# Patient Record
Sex: Female | Born: 1955 | Race: White | Hispanic: No | Marital: Married | Smoking: Never smoker
Health system: Southern US, Community
[De-identification: ages and names within clinical notes are randomized; demographics above are authoritative.]

## PROBLEM LIST (undated history)

## (undated) DIAGNOSIS — N390 Urinary tract infection, site not specified: Secondary | ICD-10-CM

## (undated) DIAGNOSIS — E785 Hyperlipidemia, unspecified: Secondary | ICD-10-CM

## (undated) DIAGNOSIS — G510 Bell's palsy: Secondary | ICD-10-CM

## (undated) DIAGNOSIS — N879 Dysplasia of cervix uteri, unspecified: Secondary | ICD-10-CM

## (undated) DIAGNOSIS — R87619 Unspecified abnormal cytological findings in specimens from cervix uteri: Secondary | ICD-10-CM

## (undated) HISTORY — PX: TUBAL LIGATION: SHX77

## (undated) HISTORY — PX: OTHER SURGICAL HISTORY: SHX169

## (undated) HISTORY — DX: Dysplasia of cervix uteri, unspecified: N87.9

## (undated) HISTORY — DX: Unspecified abnormal cytological findings in specimens from cervix uteri: R87.619

---

## 1997-07-03 DIAGNOSIS — N879 Dysplasia of cervix uteri, unspecified: Secondary | ICD-10-CM

## 1997-07-03 HISTORY — DX: Dysplasia of cervix uteri, unspecified: N87.9

## 1997-07-03 HISTORY — PX: BREAST BIOPSY: SHX20

## 1997-07-03 HISTORY — PX: LEEP: SHX91

## 1997-07-03 HISTORY — PX: TOTAL VAGINAL HYSTERECTOMY: SHX2548

## 2004-06-07 ENCOUNTER — Ambulatory Visit: Payer: Self-pay

## 2005-06-07 ENCOUNTER — Ambulatory Visit: Payer: Self-pay

## 2006-06-12 ENCOUNTER — Ambulatory Visit: Payer: Self-pay

## 2007-06-18 ENCOUNTER — Ambulatory Visit: Payer: Self-pay

## 2008-06-17 ENCOUNTER — Ambulatory Visit: Payer: Self-pay | Admitting: Internal Medicine

## 2008-10-13 ENCOUNTER — Ambulatory Visit: Payer: Self-pay

## 2010-02-08 ENCOUNTER — Ambulatory Visit: Payer: Self-pay | Admitting: General Practice

## 2010-11-07 ENCOUNTER — Ambulatory Visit: Payer: Self-pay | Admitting: Gastroenterology

## 2011-02-14 ENCOUNTER — Ambulatory Visit: Payer: Self-pay | Admitting: Unknown Physician Specialty

## 2011-02-28 ENCOUNTER — Ambulatory Visit: Payer: Self-pay | Admitting: General Practice

## 2015-07-07 ENCOUNTER — Ambulatory Visit: Payer: Self-pay | Admitting: Family

## 2015-07-07 ENCOUNTER — Encounter: Payer: Self-pay | Admitting: Family

## 2015-07-07 VITALS — BP 110/70 | HR 91 | Temp 98.4°F

## 2015-07-07 DIAGNOSIS — J019 Acute sinusitis, unspecified: Secondary | ICD-10-CM

## 2015-07-07 MED ORDER — AZITHROMYCIN 250 MG PO TABS: ORAL_TABLET | ORAL | Status: DC

## 2015-07-07 MED ORDER — FLUCONAZOLE 150 MG PO TABS: 150.0000 mg | ORAL_TABLET | Freq: Once | ORAL | Status: DC

## 2015-07-07 NOTE — Progress Notes (Signed)
S/  4 days hx nasal congestion , pressure Left ear , malaise facial pain Hx of allergies ,denies cp or sob , taking otc s   O/ VSS mildly ill appearing NAD ENT tms dull retracted , nasal turbinates swollen and red  + frontal , max tenderness , throat clear , heart rsr lungs clear A/ rhinosinusitis  P/ zpack . Nasal saline products bid and prn. Continue supportive care rx diflucan for prn

## 2015-07-19 ENCOUNTER — Ambulatory Visit: Payer: Self-pay | Admitting: Physician Assistant

## 2015-07-19 ENCOUNTER — Encounter: Payer: Self-pay | Admitting: Physician Assistant

## 2015-07-19 VITALS — BP 130/80 | HR 75 | Temp 98.4°F

## 2015-07-19 DIAGNOSIS — J069 Acute upper respiratory infection, unspecified: Secondary | ICD-10-CM

## 2015-07-19 MED ORDER — FLUCONAZOLE 150 MG PO TABS: 150.0000 mg | ORAL_TABLET | Freq: Once | ORAL | Status: DC

## 2015-07-19 MED ORDER — CEFDINIR 300 MG PO CAPS: 300.0000 mg | ORAL_CAPSULE | Freq: Two times a day (BID) | ORAL | Status: DC

## 2015-07-19 MED ORDER — METHYLPREDNISOLONE 4 MG PO TBPK: ORAL_TABLET | ORAL | Status: DC

## 2015-07-19 NOTE — Progress Notes (Signed)
S: C/o continued runny nose, congestion, and cough, has had sx for about 2 weeks, no fever, chills, cp/sob, v/d; mucus is yellow, cough is sporadic, is really tired, finished zpack, feels a little better but still sick, prednisone makes her shakey  O: PE: vitals wnl, nad, perrl eomi, + subconjunctival hemorrhage in r eye, normocephalic, tms dull, nasal mucosa red and swollen, throat injected, neck supple no lymph, lungs c t a, cv rrr, neuro intact  A:  Acute uri   P: omnicef, medrol dose pack, diflucan;  drink fluids, continue regular meds , use otc meds of choice, return if not improving in 5 days, return earlier if worsening

## 2015-09-07 ENCOUNTER — Other Ambulatory Visit: Payer: Self-pay | Admitting: Physician Assistant

## 2015-09-07 DIAGNOSIS — Z1231 Encounter for screening mammogram for malignant neoplasm of breast: Secondary | ICD-10-CM

## 2015-09-15 ENCOUNTER — Ambulatory Visit: Payer: Self-pay | Admitting: Physician Assistant

## 2015-09-16 ENCOUNTER — Ambulatory Visit
Admission: RE | Admit: 2015-09-16 | Discharge: 2015-09-16 | Disposition: A | Discharge status: Home / Self Care | Payer: Managed Care, Other (non HMO) | Source: Ambulatory Visit | Attending: Physician Assistant | Admitting: Physician Assistant

## 2015-09-16 DIAGNOSIS — Z1231 Encounter for screening mammogram for malignant neoplasm of breast: Secondary | ICD-10-CM | POA: Diagnosis not present

## 2015-09-27 ENCOUNTER — Ambulatory Visit: Payer: Self-pay | Admitting: Physician Assistant

## 2015-09-27 ENCOUNTER — Encounter: Payer: Self-pay | Admitting: Physician Assistant

## 2015-09-27 VITALS — BP 120/79 | HR 81 | Temp 98.4°F

## 2015-09-27 DIAGNOSIS — G43001 Migraine without aura, not intractable, with status migrainosus: Secondary | ICD-10-CM

## 2015-09-27 DIAGNOSIS — R1310 Dysphagia, unspecified: Secondary | ICD-10-CM

## 2015-09-27 DIAGNOSIS — Z299 Encounter for prophylactic measures, unspecified: Secondary | ICD-10-CM

## 2015-09-27 DIAGNOSIS — R5383 Other fatigue: Secondary | ICD-10-CM

## 2015-09-27 MED ORDER — RIZATRIPTAN BENZOATE 10 MG PO TABS: 10.0000 mg | ORAL_TABLET | ORAL | Status: DC | PRN

## 2015-09-27 NOTE — Progress Notes (Signed)
S: pt here for yearly labs and also has several questions 1. Would like a shingles vaccine  2. 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 Can we give a refill on her maxalt that she uses occasionally 4. ?about "dense tissue" remark on her mammogram report 5. 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

## 2015-09-28 LAB — CMP12+LP+TP+TSH+6AC+CBC/D/PLT
ALBUMIN: 4.4 g/dL (ref 3.6–4.8)
ALK PHOS: 91 IU/L (ref 39–117)
ALT: 16 IU/L (ref 0–32)
AST: 20 IU/L (ref 0–40)
Albumin/Globulin Ratio: 2.2 (ref 1.2–2.2)
BASOS: 0 %
BUN / CREAT RATIO: 29 — AB (ref 11–26)
BUN: 19 mg/dL (ref 8–27)
Basophils Absolute: 0 10*3/uL (ref 0.0–0.2)
Bilirubin Total: <0.2 mg/dL (ref 0.0–1.2)
CALCIUM: 9.1 mg/dL (ref 8.7–10.3)
CHLORIDE: 102 mmol/L (ref 96–106)
CHOL/HDL RATIO: 3.3 ratio (ref 0.0–4.4)
CHOLESTEROL TOTAL: 194 mg/dL (ref 100–199)
CREATININE: 0.65 mg/dL (ref 0.57–1.00)
EOS (ABSOLUTE): 0.2 10*3/uL (ref 0.0–0.4)
EOS: 3 %
Free Thyroxine Index: 1.8 (ref 1.2–4.9)
GFR calc Af Amer: 112 mL/min/{1.73_m2} (ref >59)
GFR calc non Af Amer: 97 mL/min/{1.73_m2} (ref >59)
GGT: 17 IU/L (ref 0–60)
GLUCOSE: 106 mg/dL — AB (ref 65–99)
Globulin, Total: 2 g/dL (ref 1.5–4.5)
HDL: 59 mg/dL (ref >39)
HEMATOCRIT: 40.6 % (ref 34.0–46.6)
HEMOGLOBIN: 13.2 g/dL (ref 11.1–15.9)
Immature Grans (Abs): 0 10*3/uL (ref 0.0–0.1)
Immature Granulocytes: 0 %
Iron: 47 ug/dL (ref 27–159)
LDH: 187 IU/L (ref 119–226)
LDL CALC: 119 mg/dL — AB (ref 0–99)
LYMPHS ABS: 1.5 10*3/uL (ref 0.7–3.1)
Lymphs: 22 %
MCH: 28 pg (ref 26.6–33.0)
MCHC: 32.5 g/dL (ref 31.5–35.7)
MCV: 86 fL (ref 79–97)
MONOS ABS: 0.3 10*3/uL (ref 0.1–0.9)
Monocytes: 4 %
NEUTROS ABS: 4.7 10*3/uL (ref 1.4–7.0)
Neutrophils: 71 %
POTASSIUM: 5.5 mmol/L — AB (ref 3.5–5.2)
Phosphorus: 3 mg/dL (ref 2.5–4.5)
Platelets: 231 10*3/uL (ref 150–379)
RBC: 4.72 x10E6/uL (ref 3.77–5.28)
RDW: 14.4 % (ref 12.3–15.4)
SODIUM: 142 mmol/L (ref 134–144)
T3 Uptake Ratio: 30 % (ref 24–39)
T4, Total: 6.1 ug/dL (ref 4.5–12.0)
TSH: 3.84 u[IU]/mL (ref 0.450–4.500)
Total Protein: 6.4 g/dL (ref 6.0–8.5)
Triglycerides: 81 mg/dL (ref 0–149)
URIC ACID: 4.1 mg/dL (ref 2.5–7.1)
VLDL Cholesterol Cal: 16 mg/dL (ref 5–40)
WBC: 6.7 10*3/uL (ref 3.4–10.8)

## 2015-09-28 LAB — B12 AND FOLATE PANEL
FOLATE: 16.9 ng/mL (ref >3.0)
VITAMIN B 12: 575 pg/mL (ref 211–946)

## 2015-09-28 LAB — HCV COMMENT:

## 2015-09-28 LAB — HEPATITIS C ANTIBODY (REFLEX): HCV Ab: <0.1 s/co ratio (ref 0.0–0.9)

## 2015-09-28 LAB — VITAMIN D 25 HYDROXY (VIT D DEFICIENCY, FRACTURES): Vit D, 25-Hydroxy: 35.2 ng/mL (ref 30.0–100.0)

## 2015-09-30 LAB — HGB A1C W/O EAG: Hgb A1c MFr Bld: 6.1 % — ABNORMAL HIGH (ref 4.8–5.6)

## 2015-09-30 LAB — SPECIMEN STATUS REPORT

## 2015-09-30 NOTE — Progress Notes (Signed)
Left message on patient's voice mail to return my call.

## 2015-10-01 NOTE — Progress Notes (Signed)
I spoke with the patient about her lab results and she expressed understanding.  I faxed a copy of the results to the patient per her request.

## 2015-10-26 ENCOUNTER — Encounter: Payer: Self-pay | Admitting: Physician Assistant

## 2015-10-26 ENCOUNTER — Ambulatory Visit: Payer: Self-pay | Admitting: Physician Assistant

## 2015-10-26 VITALS — BP 130/80 | HR 81 | Temp 98.1°F

## 2015-10-26 DIAGNOSIS — G4700 Insomnia, unspecified: Secondary | ICD-10-CM

## 2015-10-26 MED ORDER — TRAZODONE HCL 150 MG PO TABS: 150.0000 mg | ORAL_TABLET | Freq: Every evening | ORAL | Status: DC | PRN

## 2015-10-26 NOTE — Progress Notes (Signed)
S: c/o anxiety and insomnia, states her mother is sick and in short term rehab, having difficulty staying asleep, is able to fall asleep without dif but cannot stay asleep, has been waking up around 2 am every morning, has tried to "clean up" sleep habits without relief  O: vitals wnl, nad, lungs c t a, cv rrr, neuro intact  A: insomnia  P: trazadone 150mg  qhs #30 3 refills

## 2015-10-27 ENCOUNTER — Ambulatory Visit: Payer: Self-pay | Admitting: Physician Assistant

## 2015-11-02 ENCOUNTER — Encounter: Payer: Self-pay | Admitting: Obstetrics and Gynecology

## 2016-02-04 ENCOUNTER — Ambulatory Visit: Payer: Self-pay

## 2016-02-04 DIAGNOSIS — Z299 Encounter for prophylactic measures, unspecified: Secondary | ICD-10-CM

## 2016-02-04 NOTE — Progress Notes (Unsigned)
Patient came in to have blood drawn for testing per Susan's authorization. 

## 2016-02-05 LAB — COMPREHENSIVE METABOLIC PANEL
ALT: 16 IU/L (ref 0–32)
AST: 21 IU/L (ref 0–40)
Albumin/Globulin Ratio: 2.3 — ABNORMAL HIGH (ref 1.2–2.2)
Albumin: 4.5 g/dL (ref 3.6–4.8)
Alkaline Phosphatase: 76 IU/L (ref 39–117)
BILIRUBIN TOTAL: 0.3 mg/dL (ref 0.0–1.2)
BUN / CREAT RATIO: 26 (ref 12–28)
BUN: 19 mg/dL (ref 8–27)
CHLORIDE: 102 mmol/L (ref 96–106)
CO2: 26 mmol/L (ref 18–29)
Calcium: 9.2 mg/dL (ref 8.7–10.3)
Creatinine, Ser: 0.74 mg/dL (ref 0.57–1.00)
GFR calc non Af Amer: 88 mL/min/{1.73_m2} (ref >59)
GFR, EST AFRICAN AMERICAN: 101 mL/min/{1.73_m2} (ref >59)
GLUCOSE: 98 mg/dL (ref 65–99)
Globulin, Total: 2 g/dL (ref 1.5–4.5)
Potassium: 4.8 mmol/L (ref 3.5–5.2)
SODIUM: 142 mmol/L (ref 134–144)
TOTAL PROTEIN: 6.5 g/dL (ref 6.0–8.5)

## 2016-02-05 LAB — HGB A1C W/O EAG: Hgb A1c MFr Bld: 5.6 % (ref 4.8–5.6)

## 2016-08-09 ENCOUNTER — Other Ambulatory Visit: Payer: Self-pay | Admitting: Obstetrics and Gynecology

## 2016-08-09 DIAGNOSIS — Z1231 Encounter for screening mammogram for malignant neoplasm of breast: Secondary | ICD-10-CM

## 2016-08-29 ENCOUNTER — Ambulatory Visit: Payer: Self-pay | Admitting: Physician Assistant

## 2016-08-29 VITALS — BP 121/60 | HR 84 | Temp 98.6°F

## 2016-08-29 DIAGNOSIS — N390 Urinary tract infection, site not specified: Secondary | ICD-10-CM

## 2016-08-29 DIAGNOSIS — R319 Hematuria, unspecified: Principal | ICD-10-CM

## 2016-08-29 LAB — POCT URINALYSIS DIPSTICK
Bilirubin, UA: NEGATIVE
GLUCOSE UA: NEGATIVE
Ketones, UA: NEGATIVE
NITRITE UA: NEGATIVE
PROTEIN UA: NEGATIVE
Spec Grav, UA: 1.01
UROBILINOGEN UA: 0.2
pH, UA: 7

## 2016-08-29 MED ORDER — NITROFURANTOIN MONOHYD MACRO 100 MG PO CAPS: 100.0000 mg | ORAL_CAPSULE | Freq: Two times a day (BID) | ORAL | 0 refills | Status: DC

## 2016-08-29 NOTE — Progress Notes (Addendum)
S:  C/o uti sx for 7 days, urgency, frequency, and low back pain,  denies vaginal discharge, abdominal pain or flank pain:  Remainder ros neg  O:  Vitals wnl, nad, no cva tenderness, back nontender, lungs c t a,cv rrr,  n/v intact, ua trace leuks  A: uti  P: macrobid 100mg  bid x 7d, increase water intake, add cranberry juice, return if not improving in 2 -3 days, return earlier if worsening, discussed pyelonephritis sx, urine culture Pt's culture shows the bacteria is resistant to macrobid, [facility] [place]

## 2016-09-03 LAB — URINE CULTURE

## 2016-09-05 ENCOUNTER — Telehealth: Payer: Self-pay | Admitting: Obstetrics and Gynecology

## 2016-09-05 ENCOUNTER — Encounter: Payer: Self-pay | Admitting: Obstetrics and Gynecology

## 2016-09-05 ENCOUNTER — Encounter: Payer: Managed Care, Other (non HMO) | Admitting: Obstetrics and Gynecology

## 2016-09-05 MED ORDER — CIPROFLOXACIN HCL 250 MG PO TABS: 250.0000 mg | ORAL_TABLET | Freq: Two times a day (BID) | ORAL | 0 refills | Status: DC

## 2016-09-05 NOTE — Telephone Encounter (Signed)
Pt came to office today for pelvic pain for several days. She was diagnosed at PCP with UTI 08/30/16 and treated with macrobid. C&S showed resistance, so pt prescribed cipro this AM. She hasn't started med yet. She had already made appt here but didn't want to cancel.  She is s/p TVHBSO in the distant past.  Instructed pt to start cipro and f/u if sx persist in 36 hrs. Sx most likely UTI related.

## 2016-09-05 NOTE — Addendum Note (Signed)
Addended by: Faythe GheeFISHER, SUSAN W on: 09/05/2016 10:03 AM   Modules accepted: Orders

## 2016-09-06 NOTE — Progress Notes (Signed)
This encounter was created in error - please disregard.

## 2016-10-08 NOTE — Progress Notes (Signed)
HPI:      [name] is a 61 y.o. G2P2 who LMP was No LMP recorded. Patient has had a hysterectomy., presents today for her annual examination.  Her menses are absent. She is s/p TVHBSO for CPP, ovar cysts. She had a LEEP prior to [facility]. Paps should still be done.   She does not have intermenstrual bleeding.  She does not have vasomotor sx.   Sex activity: single partner, contraception - status post hysterectomy. She does have vaginal dryness. She is using lubricants without relief.  Last Pap: January 25, 2016  Results were: no abnormalities /neg HPV DNA.  Hx of STDs: HPV  Last mammogram: September 16, 2015  Results were: normal--routine follow-up in 12 months There is no FH of breast cancer. There is no FH of ovarian cancer. The patient does do self-breast exams.  Colonoscopy: 2012, repeat in 10 yrs DEXA: was screened for osteoporis in 2012, normal  Tobacco use: The patient denies current or previous tobacco use. Alcohol use: none Exercise: moderately active  She does get adequate calcium and Vitamin D in her diet.   History reviewed. No pertinent past medical history.  Past Surgical History:  Procedure Laterality Date  . BREAST BIOPSY Left 1999   benign  . TOTAL VAGINAL HYSTERECTOMY     TVHBSO due to cysts/CPP at [facility]    History reviewed. No pertinent family history.   ROS:  Review of Systems  Constitutional: Negative for fever, malaise/fatigue and weight loss.  HENT: Negative for congestion, ear pain and sinus pain.   Respiratory: Negative for cough, shortness of breath and wheezing.   Cardiovascular: Negative for chest pain, orthopnea and leg swelling.  Gastrointestinal: Negative for constipation, diarrhea, nausea and vomiting.  Genitourinary: Positive for frequency. Negative for dysuria, hematuria and urgency.       Breast ROS: negative   Musculoskeletal: Negative for back pain, joint pain and myalgias.  Skin: Negative for itching and rash.  Neurological:  Negative for dizziness, tingling, focal weakness and headaches.  Endo/Heme/Allergies: Negative for environmental allergies. Does not bruise/bleed easily.  Psychiatric/Behavioral: Negative for depression and suicidal ideas. The patient is not nervous/anxious and does not have insomnia.     Objective: BP 122/80 (BP Location: Left Arm, Patient Position: Sitting, Cuff Size: Normal)   Pulse 67   Ht  (1.575 m)   Wt 133 lb (60.3 kg)   BMI 24.33 kg/m    Physical Exam  Constitutional: She is oriented to person, place, and time. She appears well-developed and well-nourished.  Genitourinary: Vagina normal. No erythema or tenderness in the vagina. No vaginal discharge found. Right adnexum does not display mass and does not display tenderness. Left adnexum does not display mass and does not display tenderness.  Neck: Normal range of motion. No thyromegaly present.  Cardiovascular: Normal rate, regular rhythm and normal heart sounds.   No murmur heard. Pulmonary/Chest: Effort normal and breath sounds normal. Right breast exhibits no mass, no nipple discharge, no skin change and no tenderness. Left breast exhibits no mass, no nipple discharge, no skin change and no tenderness.  Abdominal: Soft. There is no tenderness. There is no guarding.  Musculoskeletal: Normal range of motion.  Neurological: She is alert and oriented to person, place, and time. No cranial nerve deficit.  Psychiatric: She has a normal mood and affect. Her behavior is normal.  Vitals reviewed.   Assessment/Plan: Encounter for annual routine gynecological examination  Screening for breast cancer - Plan: MM DIGITAL  SCREENING BILATERAL  Vaginal dryness, menopausal - Try coconut oil. Can add vag ERT prn.           GYN counsel mammography screening, adequate intake of calcium and vitamin D     F/U  Return in about 1 year (around 10/09/2017).  Alicia B. Copland, PA-C 10/09/2016 8:55 AM

## 2016-10-09 ENCOUNTER — Ambulatory Visit (INDEPENDENT_AMBULATORY_CARE_PROVIDER_SITE_OTHER): Payer: Managed Care, Other (non HMO) | Admitting: Obstetrics and Gynecology

## 2016-10-09 ENCOUNTER — Ambulatory Visit: Payer: Self-pay | Admitting: Physician Assistant

## 2016-10-09 ENCOUNTER — Ambulatory Visit
Admission: RE | Admit: 2016-10-09 | Discharge: 2016-10-09 | Disposition: A | Discharge status: Home / Self Care | Payer: Managed Care, Other (non HMO) | Source: Ambulatory Visit | Attending: Obstetrics and Gynecology | Admitting: Obstetrics and Gynecology

## 2016-10-09 ENCOUNTER — Encounter: Payer: Self-pay | Admitting: Obstetrics and Gynecology

## 2016-10-09 VITALS — BP 122/80 | HR 67 | Ht 62.0 in | Wt 133.0 lb

## 2016-10-09 DIAGNOSIS — Z01419 Encounter for gynecological examination (general) (routine) without abnormal findings: Secondary | ICD-10-CM

## 2016-10-09 DIAGNOSIS — Z1231 Encounter for screening mammogram for malignant neoplasm of breast: Secondary | ICD-10-CM

## 2016-10-09 DIAGNOSIS — N951 Menopausal and female climacteric states: Secondary | ICD-10-CM

## 2016-10-09 DIAGNOSIS — Z299 Encounter for prophylactic measures, unspecified: Secondary | ICD-10-CM

## 2016-10-09 DIAGNOSIS — Z1239 Encounter for other screening for malignant neoplasm of breast: Secondary | ICD-10-CM

## 2016-10-09 NOTE — Progress Notes (Signed)
Patient came in to have her blood drawn for testing.  She expressed that it has been a year since her last blood draw.

## 2016-10-10 LAB — CMP12+LP+TP+TSH+6AC+CBC/D/PLT
ALBUMIN: 4.2 g/dL (ref 3.6–4.8)
ALT: 15 IU/L (ref 0–32)
AST: 17 IU/L (ref 0–40)
Albumin/Globulin Ratio: 1.9 (ref 1.2–2.2)
Alkaline Phosphatase: 80 IU/L (ref 39–117)
BASOS: 1 %
BUN / CREAT RATIO: 19 (ref 12–28)
BUN: 15 mg/dL (ref 8–27)
Basophils Absolute: 0 10*3/uL (ref 0.0–0.2)
Bilirubin Total: 0.3 mg/dL (ref 0.0–1.2)
CALCIUM: 9.4 mg/dL (ref 8.7–10.3)
CHLORIDE: 103 mmol/L (ref 96–106)
CHOLESTEROL TOTAL: 190 mg/dL (ref 100–199)
Chol/HDL Ratio: 3.6 ratio (ref 0.0–4.4)
Creatinine, Ser: 0.78 mg/dL (ref 0.57–1.00)
EOS (ABSOLUTE): 0.1 10*3/uL (ref 0.0–0.4)
EOS: 3 %
ESTIMATED CHD RISK: 0.6 times avg. (ref 0.0–1.0)
FREE THYROXINE INDEX: 1.7 (ref 1.2–4.9)
GFR calc Af Amer: 95 mL/min/{1.73_m2} (ref >59)
GFR, EST NON AFRICAN AMERICAN: 82 mL/min/{1.73_m2} (ref >59)
GGT: 16 IU/L (ref 0–60)
GLUCOSE: 98 mg/dL (ref 65–99)
Globulin, Total: 2.2 g/dL (ref 1.5–4.5)
HDL: 53 mg/dL (ref >39)
HEMATOCRIT: 41.5 % (ref 34.0–46.6)
HEMOGLOBIN: 13.9 g/dL (ref 11.1–15.9)
IMMATURE GRANULOCYTES: 0 %
IRON: 93 ug/dL (ref 27–139)
Immature Grans (Abs): 0 10*3/uL (ref 0.0–0.1)
LDH: 180 IU/L (ref 119–226)
LDL Calculated: 105 mg/dL — ABNORMAL HIGH (ref 0–99)
LYMPHS ABS: 1.3 10*3/uL (ref 0.7–3.1)
Lymphs: 29 %
MCH: 28.1 pg (ref 26.6–33.0)
MCHC: 33.5 g/dL (ref 31.5–35.7)
MCV: 84 fL (ref 79–97)
MONOS ABS: 0.3 10*3/uL (ref 0.1–0.9)
Monocytes: 6 %
NEUTROS PCT: 61 %
Neutrophils Absolute: 2.8 10*3/uL (ref 1.4–7.0)
PHOSPHORUS: 2.8 mg/dL (ref 2.5–4.5)
PLATELETS: 190 10*3/uL (ref 150–379)
Potassium: 4.7 mmol/L (ref 3.5–5.2)
RBC: 4.95 x10E6/uL (ref 3.77–5.28)
RDW: 14.6 % (ref 12.3–15.4)
SODIUM: 141 mmol/L (ref 134–144)
T3 UPTAKE RATIO: 26 % (ref 24–39)
T4, Total: 6.4 ug/dL (ref 4.5–12.0)
TOTAL PROTEIN: 6.4 g/dL (ref 6.0–8.5)
TSH: 4.82 u[IU]/mL — ABNORMAL HIGH (ref 0.450–4.500)
Triglycerides: 159 mg/dL — ABNORMAL HIGH (ref 0–149)
Uric Acid: 3.8 mg/dL (ref 2.5–7.1)
VLDL Cholesterol Cal: 32 mg/dL (ref 5–40)
WBC: 4.6 10*3/uL (ref 3.4–10.8)

## 2016-10-10 LAB — VITAMIN D 25 HYDROXY (VIT D DEFICIENCY, FRACTURES): Vit D, 25-Hydroxy: 44.3 ng/mL (ref 30.0–100.0)

## 2017-02-28 ENCOUNTER — Encounter: Payer: Self-pay | Admitting: Physician Assistant

## 2017-02-28 ENCOUNTER — Ambulatory Visit: Payer: Self-pay | Admitting: Physician Assistant

## 2017-02-28 VITALS — BP 140/83 | HR 79 | Temp 98.5°F | Resp 16

## 2017-02-28 DIAGNOSIS — R3915 Urgency of urination: Secondary | ICD-10-CM

## 2017-02-28 DIAGNOSIS — M545 Low back pain, unspecified: Secondary | ICD-10-CM

## 2017-02-28 LAB — POCT URINALYSIS DIPSTICK
Bilirubin, UA: NEGATIVE
GLUCOSE UA: NEGATIVE
Ketones, UA: NEGATIVE
LEUKOCYTES UA: NEGATIVE
NITRITE UA: NEGATIVE
Protein, UA: NEGATIVE
Spec Grav, UA: 1.015 (ref 1.010–1.025)
UROBILINOGEN UA: 0.2 U/dL
pH, UA: 7 (ref 5.0–8.0)

## 2017-02-28 NOTE — Progress Notes (Signed)
S:  C/o uti sx for 2 days, urgency, frequency, some low back pain, denies vaginal discharge, abdominal pain or flank pain:  Remainder ros neg  O:  Vitals wnl, nad, no cva tenderness, back nontender, lungs c t a,cv rrr, abd soft nontender, bs normal, n/v intact, ua wnl  A: uti  P:  increase water intake, add cranberry juice, return if not improving in 2 -3 days, return earlier if worsening, discussed pyelonephritis sx, take otc aleve, will order culture as last time culture showed infection

## 2017-03-02 ENCOUNTER — Other Ambulatory Visit: Payer: Self-pay | Admitting: Physician Assistant

## 2017-03-02 MED ORDER — CIPROFLOXACIN HCL 250 MG PO TABS: 250.0000 mg | ORAL_TABLET | Freq: Two times a day (BID) | ORAL | 0 refills | Status: DC

## 2017-03-02 NOTE — Progress Notes (Signed)
[facility], pt's urine culture shows e. coli

## 2017-03-03 LAB — URINE CULTURE

## 2017-07-05 ENCOUNTER — Ambulatory Visit: Payer: Self-pay | Admitting: Physician Assistant

## 2017-07-05 ENCOUNTER — Encounter: Payer: Self-pay | Admitting: Physician Assistant

## 2017-07-05 VITALS — BP 120/70 | HR 88 | Temp 99.0°F | Resp 16

## 2017-07-05 DIAGNOSIS — J0120 Acute ethmoidal sinusitis, unspecified: Secondary | ICD-10-CM

## 2017-07-05 MED ORDER — FEXOFENADINE-PSEUDOEPHED ER 60-120 MG PO TB12: 1.0000 | ORAL_TABLET | Freq: Two times a day (BID) | ORAL | 0 refills | Status: DC

## 2017-07-05 MED ORDER — AMOXICILLIN 875 MG PO TABS: 875.0000 mg | ORAL_TABLET | Freq: Two times a day (BID) | ORAL | 0 refills | Status: DC

## 2017-07-05 MED ORDER — SUMATRIPTAN SUCCINATE 100 MG PO TABS: 100.0000 mg | ORAL_TABLET | ORAL | 0 refills | Status: DC | PRN

## 2017-07-05 MED ORDER — BENZONATATE 100 MG PO CAPS: 100.0000 mg | ORAL_CAPSULE | Freq: Three times a day (TID) | ORAL | 0 refills | Status: DC | PRN

## 2017-07-05 NOTE — Progress Notes (Signed)
   Subjective: Sinus and ear pressure     Patient ID: Darlene Yang, female    DOB: 1955-11-15, 62 y.o.   MRN: [id number]  HPI Patient complain to 10 days of sinus congestion and right ear pain/pressure. Patient also complained of frontal headache. Patient states sore throat secondary to postnasal drainage. Patient states productive cough. Patient denies nausea vomiting or diarrhea.   Review of Systems Migraine headaches    Objective:   Physical Exam HEENT is remarkable bilateral maxillary guarding. Bilateral edematous nasal turbinates. Greenish nasal discharge. Throat is erythematous right TMs edematous but nonerythematous. Neck is supple without adenopathy. Lungs clear to auscultation discharge regular rate and rhythm.       Assessment & Plan: Sinusitis   Patient given discharge care instructions advised take medications as directed. Patient advised follow-up PCP is no improvement in 3-5 days.

## 2017-07-13 ENCOUNTER — Telehealth: Payer: Self-pay | Admitting: Emergency Medicine

## 2017-07-13 NOTE — Telephone Encounter (Signed)
Patient called and expressed that she finished the medication that was prescribed to her on 1/10 and her symptoms continues to get worse.  I advised Ellie LunchHeather Ratcliffe, PA-C and she authorized me to call in [facility] [place].  I called the patient back and informed her of the script that was called in.

## 2017-09-11 ENCOUNTER — Other Ambulatory Visit: Payer: Self-pay

## 2017-09-11 ENCOUNTER — Ambulatory Visit
Admission: EM | Admit: 2017-09-11 | Discharge: 2017-09-11 | Disposition: A | Discharge status: Home / Self Care | Payer: Managed Care, Other (non HMO) | Attending: Family Medicine | Admitting: Family Medicine

## 2017-09-11 DIAGNOSIS — R399 Unspecified symptoms and signs involving the genitourinary system: Secondary | ICD-10-CM | POA: Diagnosis not present

## 2017-09-11 LAB — URINALYSIS, COMPLETE (UACMP) WITH MICROSCOPIC
Bacteria, UA: NONE SEEN
Bilirubin Urine: NEGATIVE
Glucose, UA: NEGATIVE mg/dL
Ketones, ur: NEGATIVE mg/dL
Leukocytes, UA: NEGATIVE
Nitrite: NEGATIVE
PROTEIN: NEGATIVE mg/dL
SPECIFIC GRAVITY, URINE: 1.01 (ref 1.005–1.030)
SQUAMOUS EPITHELIAL / LPF: NONE SEEN
WBC, UA: NONE SEEN WBC/hpf (ref 0–5)
pH: 7 (ref 5.0–8.0)

## 2017-09-11 MED ORDER — SULFAMETHOXAZOLE-TRIMETHOPRIM 800-160 MG PO TABS: 1.0000 | ORAL_TABLET | Freq: Two times a day (BID) | ORAL | 0 refills | Status: DC

## 2017-09-11 NOTE — Discharge Instructions (Signed)
Antibiotic as prescribed. ° °We will call regarding the culture. ° °Take care ° °Dr. Angelus Hoopes  °

## 2017-09-11 NOTE — ED Provider Notes (Signed)
[facility]  CSN: [id number] Arrival date & time: 09/11/17  1128  History   Chief Complaint Chief Complaint  Patient presents with  . Urinary Frequency    APPT   HPI  62 year old female presents with urinary symptoms.  Patient reports that her symptoms started on Sunday.  She reports back pain, abdominal pain, urinary frequency and urgency.  She states that this is classic symptoms of UTI for her.  She has had prior UTIs in the past.  No fever.  No hematuria.  No known exacerbating relieving factors.  No medications tried.  No other complaints.  PMH - Hx of sinusitis, Hx of UTI  Past Surgical History:  Procedure Laterality Date  . BREAST BIOPSY Left 1999   benign  . TOTAL VAGINAL HYSTERECTOMY     [facility]    OB History    Gravida Para Term Preterm AB Living   2 2           SAB TAB Ectopic Multiple Live Births           2     Home Medications    Prior to Admission medications   Medication Sig Start Date End Date Taking? Authorizing Provider  Multiple Vitamin (MULTIVITAMIN) tablet Take 1 tablet by mouth daily.   Yes [provider]  sulfamethoxazole-trimethoprim (BACTRIM DS,SEPTRA DS) 800-160 MG tablet Take 1 tablet by mouth 2 (two) times daily for 7 days. 09/11/17 09/18/17  Tommie Samsook, Rhyen Mazariego G, DO    Family History Family History  Problem Relation Age of Onset  . Breast cancer Neg Hx     Social History Social History   Tobacco Use  . Smoking status: Never Smoker  . Smokeless tobacco: Never Used  Substance Use Topics  . Alcohol use: No    Alcohol/week: 0.0 oz  . Drug use: No     Allergies   Prednisone   Review of Systems Review of Systems  Constitutional: Negative for fever.  Gastrointestinal: Positive for abdominal pain.  Genitourinary: Positive for frequency and urgency.  Musculoskeletal: Positive for back pain.   Physical Exam Triage Vital Signs ED Triage Vitals  Enc Vitals Group     BP 09/11/17 1153 139/79      Pulse Rate 09/11/17 1153 88     Resp 09/11/17 1153 18     Temp 09/11/17 1153 99 F (37.2 C)     Temp Source 09/11/17 1153 Oral     SpO2 09/11/17 1153 98 %     Weight 09/11/17 1150 130 lb (59 kg)     Height 09/11/17 1150 5\' 2"  (1.575 m)     Head Circumference --      Peak Flow --      Pain Score 09/11/17 1150 7     Pain Loc --      Pain Edu? --      Excl. in GC? --    Updated Vital Signs BP 139/79 (BP Location: Left Arm)   Pulse 88   Temp 99 F (37.2 C) (Oral)   Resp 18   Ht 5\' 2"  (1.575 m)   Wt 130 lb (59 kg)   SpO2 98%   BMI 23.78 kg/m   Physical Exam  Constitutional: She is oriented to person, place, and time. She appears well-developed. No distress.  Cardiovascular: Normal rate and regular rhythm.  Pulmonary/Chest: Effort normal and breath sounds normal. She has no wheezes. She has no rales.  Abdominal:  Soft, nondistended. Mild  suprapubic tenderness.  Neurological: She is alert and oriented to person, place, and time.  Psychiatric: She has a normal mood and affect. Her behavior is normal.  Nursing note and vitals reviewed.  UC Treatments / Results  Labs (all labs ordered are listed, but only abnormal results are displayed) Labs Reviewed  URINALYSIS, COMPLETE (UACMP) WITH MICROSCOPIC - Abnormal; Notable for the following components:      Result Value   Color, Urine STRAW (*)    Hgb urine dipstick TRACE (*)    All other components within normal limits  URINE CULTURE    EKG  EKG Interpretation None       Radiology No results found.  Procedures Procedures (including critical care time)  Medications Ordered in UC Medications - No data to display   Initial Impression / Assessment and Plan / UC Course  I have reviewed the triage vital signs and the nursing notes.  Pertinent labs & imaging results that were available during my care of the patient were reviewed by me and considered in my medical decision making (see chart for details).    62  year old female presents with urinary symptoms.  Trace hemoglobin on dipstick but no white blood cells or red blood cells noted on microscopy.  She has had negative appearing urines previously with positive cultures.  As a result, I am treating her empirically with Bactrim.  This is based on her prior cultures.  Final Clinical Impressions(s) / UC Diagnoses   Final diagnoses:  UTI symptoms    ED Discharge Orders        Ordered    sulfamethoxazole-trimethoprim (BACTRIM DS,SEPTRA DS) 800-160 MG tablet  2 times daily     09/11/17 1253     Controlled Substance Prescriptions [place] Controlled Substance Registry consulted? Not Applicable   Tommie Sams, DO 09/11/17 1314

## 2017-09-11 NOTE — ED Triage Notes (Signed)
Patient complains of urinary urgency, frequency, pain with urination, low back pain and lower abdominal pain and pressure x Sunday night.

## 2017-09-12 LAB — URINE CULTURE: Culture: NO GROWTH

## 2017-09-27 ENCOUNTER — Encounter: Payer: Self-pay | Admitting: Family Medicine

## 2017-09-27 ENCOUNTER — Ambulatory Visit: Payer: Self-pay | Admitting: Family Medicine

## 2017-09-27 VITALS — BP 136/89 | HR 89 | Temp 98.4°F | Resp 20 | Ht 62.0 in | Wt 134.0 lb

## 2017-09-27 DIAGNOSIS — Z008 Encounter for other general examination: Secondary | ICD-10-CM

## 2017-09-27 DIAGNOSIS — Z0189 Encounter for other specified special examinations: Principal | ICD-10-CM

## 2017-09-27 LAB — GLUCOSE, POCT (MANUAL RESULT ENTRY): POC GLUCOSE: 107 mg/dL — AB (ref 70–99)

## 2017-09-27 NOTE — Progress Notes (Signed)
Subjective: Annual biometrics screening  Patient presents for her annual biometric screening. Patient denies any past or current medical problems.  No complaints or concerns today. PCP: None currently. Patient works for [employer], [employer]. Patient denies any other issues or concerns.   Review of Systems Constitutional: Unremarkable.  HEENT: Denies dizziness, issues with hearing, vision problems.  Gastrointestinal: Denies issues with bowel or bladder.  Respiratory: Unremarkable.   Cardiovascular: Unremarkable.  ROS otherwise negative.   Objective  Physical Exam General: Awake, alert and oriented. No acute distress. Well developed, hydrated and nourished. Appears stated age.  HEENT:Supple neck without adenopathy. Sclera is non-icteric. The ear canal is clear without discharge. The tympanic membrane is normal in appearance with normal landmarks and cone of light. Nasal mucosa is pink and moist. Oral mucosa is pink and moist. The pharynx is normal in appearance without tonsillar swelling or exudates.  Skin: Skin in warm, dry and intact without rashes or lesions. Appropriate color for ethnicity. Cardiac: Heart rate and rhythm are normal. No murmurs, gallops, or rubs are auscultated.  Respiratory: The chest wall is symmetric and without deformity. No signs of respiratory distress. Lung sounds are clear in all lobes bilaterally without rales, ronchi, or wheezes.  Abdominal: Abdomen is soft, symmetric, and non-tender without distention. No masses, hepatomegaly, or splenomegaly are noted.  Neurological: The patient is awake, alert and oriented to person, place, and time with normal speech.  Memory is normal and thought processes intact. No gait abnormalities are appreciated.  Psychiatric: Appropriate mood and affect.   Assessment Annual biometrics screen  Plan  Labs pending. Encouraged routine visits with primary care provider.  Provided patient with resources to establish care with a new primary  care provider. Patient's fasting blood sugar is 107 today, informed patient of implications of prediabetes and diabetes.  Discussed healthy lifestyle modifications including healthy well-balanced diet and exercise.  Advised patient to follow-up with her primary care provider at her first appointment regarding this.

## 2017-09-28 LAB — LIPID PANEL
CHOL/HDL RATIO: 4.1 ratio (ref 0.0–4.4)
Cholesterol, Total: 243 mg/dL — ABNORMAL HIGH (ref 100–199)
HDL: 60 mg/dL (ref >39)
LDL Calculated: 162 mg/dL — ABNORMAL HIGH (ref 0–99)
Triglycerides: 104 mg/dL (ref 0–149)
VLDL Cholesterol Cal: 21 mg/dL (ref 5–40)

## 2017-09-28 NOTE — Progress Notes (Signed)
Dear Darlene Yang, I wanted to let you know that your lipid panel came back.  Your total cholesterol is 243, normal values are between 100 and 199.  Your triglyceride level is 104, normal values are between 0 and 149.  Your VLDL cholesterol is 21, normal values are between 5 and 40. Your HDL cholesterol ("good cholesterol") is 60, normal values are above 39.  Your LDL cholesterol ("bad cholesterol") is 162, normal values are below 99.  Your cholesterol/HDL ratio is 4.1, normal values are between 0 and 4.4 for women or 0 and 5 from men.  These abnormal values (elevated total cholesterol and elevated LDL) increase your risk for cardiovascular disease.  I wanted you to be aware of these results so that you can discuss these with your primary care provider at your next regularly scheduled visit.

## 2017-10-04 ENCOUNTER — Other Ambulatory Visit: Payer: Self-pay | Admitting: Obstetrics and Gynecology

## 2017-10-04 DIAGNOSIS — Z1231 Encounter for screening mammogram for malignant neoplasm of breast: Secondary | ICD-10-CM

## 2017-10-24 ENCOUNTER — Encounter: Payer: Self-pay | Admitting: Obstetrics and Gynecology

## 2017-10-25 ENCOUNTER — Ambulatory Visit
Admission: RE | Admit: 2017-10-25 | Discharge: 2017-10-25 | Disposition: A | Discharge status: Home / Self Care | Payer: Managed Care, Other (non HMO) | Source: Ambulatory Visit | Attending: Obstetrics and Gynecology | Admitting: Obstetrics and Gynecology

## 2017-10-25 ENCOUNTER — Ambulatory Visit (INDEPENDENT_AMBULATORY_CARE_PROVIDER_SITE_OTHER): Payer: Managed Care, Other (non HMO) | Admitting: Obstetrics and Gynecology

## 2017-10-25 ENCOUNTER — Encounter: Payer: Self-pay | Admitting: Obstetrics and Gynecology

## 2017-10-25 VITALS — BP 120/80 | HR 69 | Ht 62.0 in | Wt 132.0 lb

## 2017-10-25 DIAGNOSIS — Z01419 Encounter for gynecological examination (general) (routine) without abnormal findings: Secondary | ICD-10-CM | POA: Diagnosis not present

## 2017-10-25 DIAGNOSIS — Z1231 Encounter for screening mammogram for malignant neoplasm of breast: Secondary | ICD-10-CM | POA: Diagnosis not present

## 2017-10-25 DIAGNOSIS — Z1239 Encounter for other screening for malignant neoplasm of breast: Secondary | ICD-10-CM

## 2017-10-25 DIAGNOSIS — N879 Dysplasia of cervix uteri, unspecified: Secondary | ICD-10-CM | POA: Diagnosis not present

## 2017-10-25 NOTE — Progress Notes (Signed)
Chief Complaint  Patient presents with  . Gynecologic Exam     HPI:      [name] is a 62 y.o. G2P2 who LMP was No LMP recorded. Patient has had a hysterectomy., presents today for her annual examination.  Her menses are absent. She is s/p TVHBSO for CPP, ovar cysts. [facility]. Paps should still be done.   She does not have intermenstrual bleeding. She does not have vasomotor sx.   Sex activity: single partner, contraception - status post hysterectomy. She does have vaginal dryness. She is using coconut oil with relief.  Last Pap: January 25, 2016  Results were: no abnormalities /neg HPV DNA.  Hx of STDs: HPV  Last mammogram: 10/09/16  Results were: normal--routine follow-up in 12 months. Pt had mammo today--awaiting results.  There is no FH of breast cancer. There is no FH of ovarian cancer. The patient does do self-breast exams.  Colonoscopy: 2012, repeat in 10 yrs DEXA: was screened for osteoporis in 2012, normal  Tobacco use: The patient denies current or previous tobacco use. Alcohol use: none Exercise: moderately active  She does get adequate calcium and Vitamin D in her diet.  Labs through work.   Past Medical History:  Diagnosis Date  . Abnormal Pap smear of cervix    s/p LEEP  . Cervical dysplasia 1999    Past Surgical History:  Procedure Laterality Date  . BREAST BIOPSY Left 1999   benign needle bx per pt  . LEEP  1999  . TOTAL VAGINAL HYSTERECTOMY  1999   [facility]    Family History  Problem Relation Age of Onset  . Breast cancer Neg Hx     Social History   Socioeconomic History  . Marital status: Married    Spouse name: Not on file  . Number of children: Not on file  . Years of education: Not on file  . Highest education level: Not on file  Occupational History  . Not on file  Social Needs  . Financial resource strain: Not on file  . Food insecurity:    Worry: Not on file   Inability: Not on file  . Transportation needs:    Medical: Not on file    Non-medical: Not on file  Tobacco Use  . Smoking status: Never Smoker  . Smokeless tobacco: Never Used  Substance and Sexual Activity  . Alcohol use: No    Alcohol/week: 0.0 oz  . Drug use: No  . Sexual activity: Yes    Birth control/protection: Other-see comments    Comment: hysterectomy  Lifestyle  . Physical activity:    Days per week: Not on file    Minutes per session: Not on file  . Stress: Not on file  Relationships  . Social connections:    Talks on phone: Not on file    Gets together: Not on file    Attends religious service: Not on file    Active member of club or organization: Not on file    Attends meetings of clubs or organizations: Not on file    Relationship status: Not on file  . Intimate partner violence:    Fear of current or ex partner: Not on file    Emotionally abused: Not on file    Physically abused: Not on file    Forced sexual activity: Not on file  Other Topics Concern  . Not on file  Social History Narrative  . Not on file    Current Outpatient Medications on File Prior to Visit  Medication Sig Dispense Refill  . Multiple Vitamin (MULTIVITAMIN) tablet Take 1 tablet by mouth daily.     No current facility-administered medications on file prior to visit.     ROS:  Review of Systems  Constitutional: Negative for fatigue, fever and unexpected weight change.  Respiratory: Negative for cough, shortness of breath and wheezing.   Cardiovascular: Negative for chest pain, palpitations and leg swelling.  Gastrointestinal: Negative for blood in stool, constipation, diarrhea, nausea and vomiting.  Endocrine: Negative for cold intolerance, heat intolerance and polyuria.  Genitourinary: Negative for dyspareunia, dysuria, flank pain, frequency, genital sores, hematuria, menstrual problem, pelvic pain, urgency, vaginal bleeding, vaginal discharge and vaginal pain.    Musculoskeletal: Negative for back pain, joint swelling and myalgias.  Skin: Negative for rash.  Neurological: Negative for dizziness, syncope, light-headedness, numbness and headaches.  Hematological: Negative for adenopathy.  Psychiatric/Behavioral: Negative for agitation, confusion, sleep disturbance and suicidal ideas. The patient is not nervous/anxious.      Objective: BP 120/80   Pulse 69   Ht 5\' 2"  (1.575 m)   Wt 132 lb (59.9 kg)   BMI 24.14 kg/m    Physical Exam  Constitutional: She is oriented to person, place, and time. She appears well-developed and well-nourished.  Genitourinary: Vagina normal and uterus normal. There is no rash or tenderness on the right labia. There is no rash or tenderness on the left labia. No erythema or tenderness in the vagina. No vaginal discharge found. Right adnexum does not display mass and does not display tenderness. Left adnexum does not display mass and does not display tenderness. Cervix does not exhibit motion tenderness or polyp. Uterus is not enlarged or tender.  Neck: Normal range of motion. No thyromegaly present.  Cardiovascular: Normal rate, regular rhythm and normal heart sounds.  No murmur heard. Pulmonary/Chest: Effort normal and breath sounds normal. Right breast exhibits no mass, no nipple discharge, no skin change and no tenderness. Left breast exhibits no mass, no nipple discharge, no skin change and no tenderness.  Abdominal: Soft. There is no tenderness. There is no guarding.  Musculoskeletal: Normal range of motion.  Neurological: She is alert and oriented to person, place, and time. No cranial nerve deficit.  Psychiatric: She has a normal mood and affect. Her behavior is normal.  Vitals reviewed.   Assessment/Plan: Encounter for annual routine gynecological examination  Screening for breast cancer - Pt had mammo today.  Cervical dysplasia - S/p LEEP 1999. Pap smear due next yr.         GYN counsel breast self  exam, mammography screening, menopause, adequate intake of calcium and vitamin D, diet and exercise     F/U  Return in about 1 year (around 10/26/2018).  Raiquan Chandler B. Donya Hitch, PA-C 10/25/2017 8:38 AM

## 2017-10-25 NOTE — Patient Instructions (Signed)
I value your feedback and entrusting us with your care. If you get a [facility] patient survey, I would appreciate you taking the time to let us know about your experience today. Thank you! 

## 2017-10-26 ENCOUNTER — Other Ambulatory Visit: Payer: Self-pay | Admitting: Obstetrics and Gynecology

## 2017-10-26 DIAGNOSIS — R921 Mammographic calcification found on diagnostic imaging of breast: Secondary | ICD-10-CM

## 2017-10-26 DIAGNOSIS — R928 Other abnormal and inconclusive findings on diagnostic imaging of breast: Secondary | ICD-10-CM

## 2017-11-06 ENCOUNTER — Ambulatory Visit
Admission: RE | Admit: 2017-11-06 | Discharge: 2017-11-06 | Disposition: A | Discharge status: Home / Self Care | Payer: Managed Care, Other (non HMO) | Source: Ambulatory Visit | Attending: Obstetrics and Gynecology | Admitting: Obstetrics and Gynecology

## 2017-11-06 DIAGNOSIS — R921 Mammographic calcification found on diagnostic imaging of breast: Secondary | ICD-10-CM

## 2017-11-06 DIAGNOSIS — R928 Other abnormal and inconclusive findings on diagnostic imaging of breast: Secondary | ICD-10-CM | POA: Insufficient documentation

## 2017-11-07 ENCOUNTER — Encounter: Payer: Self-pay | Admitting: Obstetrics and Gynecology

## 2018-01-10 ENCOUNTER — Ambulatory Visit: Payer: Self-pay | Admitting: Family Medicine

## 2018-01-10 VITALS — BP 147/70 | HR 76 | Temp 98.6°F | Resp 16

## 2018-01-10 DIAGNOSIS — R21 Rash and other nonspecific skin eruption: Secondary | ICD-10-CM

## 2018-01-10 NOTE — Progress Notes (Signed)
Subjective: rash      Darlene Yang is a 62 y.o. female who presents for evaluation of a rash involving the lower extremity and trunk. Rash started 5 days ago. Lesion are red, pink, and purple, and flat and raised in texture. Rash has changed over time, patient reports an increase in the diameter and number of the lesions. Rash is painful and is pruritic. Associated symptoms: decrease in energy level and headache. Patient denies: abdominal pain, arthralgia, congestion, cough, decrease in appetite, fever, irritability, myalgia, nausea, sore throat or vomiting. Patient has not had contacts with similar rash. Patient has not had new exposures (soaps, lotions, laundry detergents, foods, medications, plants, or insects). Patient was interacting with farm animals last week while visiting her daughter. Denies any known insect or tick bites.  Denies any relevant dermatologic history.  Patient has listed an allergy to prednisone, reports "it messes with my head". Treatment attempted at home: Applied alcohol and "lotion for insect bites" with little to no relief.  Review of Systems Pertinent items noted in HPI and remainder of comprehensive ROS otherwise negative.    Objective:    BP (!) 147/70 (BP Location: Left Arm, Patient Position: Sitting, Cuff Size: Normal)   Pulse 76   Temp 98.6 F (37 C) (Oral)   Resp 16   SpO2 97%  General:  alert, cooperative, appears stated age and no distress  Skin:  Pink round maculopapular rash with discrete irregular borders varying in size from 2 cm to 8 cm in diameter to right lower leg, right posterior shoulder, bilateral lower abdomen, left chest, left mid back, and right buttocks.  Pink/purple petechial/macular rash to right posterior ankle measuring approximately 2 x 4 cm and covering the left superior aspect of the fourth toe.  No palmar/sole involvement.     Assessment:    Rash     Plan:   Arranged for patient to follow-up with [facility] today at  1600 today for further evaluation.

## 2018-01-27 DIAGNOSIS — E782 Mixed hyperlipidemia: Secondary | ICD-10-CM | POA: Insufficient documentation

## 2018-01-27 DIAGNOSIS — Z8349 Family history of other endocrine, nutritional and metabolic diseases: Secondary | ICD-10-CM | POA: Insufficient documentation

## 2018-01-27 DIAGNOSIS — E559 Vitamin D deficiency, unspecified: Secondary | ICD-10-CM | POA: Insufficient documentation

## 2018-01-27 DIAGNOSIS — R739 Hyperglycemia, unspecified: Secondary | ICD-10-CM | POA: Insufficient documentation

## 2018-01-27 DIAGNOSIS — Z808 Family history of malignant neoplasm of other organs or systems: Secondary | ICD-10-CM | POA: Insufficient documentation

## 2018-04-09 ENCOUNTER — Encounter: Payer: Self-pay | Admitting: Emergency Medicine

## 2018-04-09 ENCOUNTER — Ambulatory Visit: Payer: Self-pay | Admitting: Emergency Medicine

## 2018-04-09 VITALS — BP 134/79 | HR 75 | Temp 98.4°F | Resp 12

## 2018-04-09 DIAGNOSIS — R300 Dysuria: Secondary | ICD-10-CM

## 2018-04-09 DIAGNOSIS — N390 Urinary tract infection, site not specified: Secondary | ICD-10-CM

## 2018-04-09 DIAGNOSIS — R319 Hematuria, unspecified: Principal | ICD-10-CM

## 2018-04-09 LAB — POCT URINALYSIS DIPSTICK
Bilirubin, UA: NEGATIVE
Glucose, UA: NEGATIVE
Ketones, UA: NEGATIVE
Nitrite, UA: NEGATIVE
Protein, UA: NEGATIVE
Spec Grav, UA: 1.01 (ref 1.010–1.025)
Urobilinogen, UA: 0.2 E.U./dL
pH, UA: 7 (ref 5.0–8.0)

## 2018-04-09 MED ORDER — CIPROFLOXACIN HCL 500 MG PO TABS: 500.0000 mg | ORAL_TABLET | Freq: Two times a day (BID) | ORAL | 0 refills | Status: DC

## 2018-04-09 NOTE — Patient Instructions (Addendum)
You have a urinary tract infection.   prescribing Cipro twice a day for 7 days.  Prescription sent to your pharmacy, [facility]. Urine culture sent. Follow up with your primary care physician if not improved in 5 to 7 days. Please read urinary tract infection handout   Urinary Frequency, Adult Urinary frequency means urinating more often than usual. People with urinary frequency urinate at least 8 times in 24 hours, even if they drink a normal amount of fluid. Although they urinate more often than normal, the total amount of urine produced in a day may be normal. Urinary frequency is also called pollakiuria. What are the causes? This condition may be caused by:  A urinary tract infection.  Obesity.  Bladder problems, such as bladder stones.  Caffeine or alcohol.  Eating food or drinking fluids that irritate the bladder. These include coffee, tea, soda, artificial sweeteners, citrus, tomato-based foods, and chocolate.  Certain medicines, such as medicines that help the body get rid of extra fluid (diuretics).  Muscle or nerve weakness.  Overactive bladder.  Chronic diabetes.  Interstitial cystitis.  In men, problems with the prostate, such as an enlarged prostate.  In women, pregnancy.  In some cases, the cause may not be known. What increases the risk? This condition is more likely to develop in:  Women who have gone through menopause.  Men with prostate problems.  People with a disease or injury that affects the nerves or spinal cord.  People who have or have had a condition that affects the brain, such as a stroke.  What are the signs or symptoms? Symptoms of this condition include:  Feeling an urgent need to urinate often. The stress and anxiety of needing to find a bathroom quickly can make this urge worse.  Urinating 8 or more times in 24 hours.  Urinating as often as every 1 to 2 hours.  How is this diagnosed? This condition is diagnosed based on your  symptoms, your medical history, and a physical exam. You may have tests, such as:  Blood tests.  Urine tests.  Imaging tests, such as X-rays or ultrasounds.  A bladder test.  A test of your neurological system. This is the body system that senses the need to urinate.  A test to check for problems in the urethra and bladder called cystoscopy.  You may also be asked to keep a bladder diary. A bladder diary is a record of what you eat and drink, how often you urinate, and how much you urinate. You may need to see a health care provider who specializes in conditions of the urinary tract (urologist) or kidneys (nephrologist). How is this treated? Treatment for this condition depends on the cause. Sometimes the condition goes away on its own and treatment is not necessary. If treatment is needed, it may include:  Taking medicine.  Learning exercises that strengthen the muscles that help control urination.  Following a bladder training program. This may include: ? Learning to delay going to the bathroom. ? Double urinating (voiding). This helps if you are not completely emptying your bladder. ? Scheduled voiding.  Making diet changes, such as: ? Avoiding caffeine. ? Drinking fewer fluids, especially alcohol. ? Not drinking in the evening. ? Not having foods or drinks that may irritate the bladder. ? Eating foods that help prevent or ease constipation. Constipation can make this condition worse.  Having the nerves in your bladder stimulated. There are two options for stimulating the nerves to your bladder: ?  Outpatient electrical nerve stimulation. This is done by your health care provider. ? Surgery to implant a bladder pacemaker. The pacemaker helps to control the urge to urinate.  Follow these instructions at home:  Keep a bladder diary if told to by your health care provider.  Take over-the-counter and prescription medicines only as told by your health care provider.  Do any  exercises as told by your health care provider.  Follow a bladder training program as told by your health care provider.  Make any recommended diet changes.  Keep all follow-up visits as told by your health care provider. This is important. Contact a health care provider if:  You start urinating more often.  You feel pain or irritation when you urinate.  You notice blood in your urine.  Your urine looks cloudy.  You develop a fever.  You begin vomiting. Get help right away if:  You are unable to urinate.

## 2018-04-09 NOTE — Progress Notes (Signed)
The patient stated that her back pain presented Saturday 04/06/18 along with fatigue. No burning  But some Suprapubic pain. She stated that typically a culture needs to be sent out for results.

## 2018-04-09 NOTE — Progress Notes (Signed)
  Subjective:     Patient ID: Darlene Yang, female   DOB: April 05, 1956, 62 y.o.   MRN: [id number]  HPI This is a 62 y.o. female who presents today with UTI symptoms for 3 days. She states that UTIs she has had in the past have started with exact same symptoms she is having now, with minimal dysuria, occasional frequency, without urgency.  She states her main symptom is suprapubic pain and vague low back pain.  Recalls no injury. Reviewing her history and chart, she has had 1 or 2 UTIs per year, in the past. Urine culture showed some resistant bacteria in the past, and Cipro has worked great in the past without side effects.  No hematuria No vaginal discharge No fever/chil ls  No nausea No vomiting  Mild fatigue.  No focal neurologic symptoms.  She denies any syncope or other systemic symptoms She denies chance of pregnancy.  Status post hysterectomy in the past Has tried over-the-counter measures without improvement.   Review of Systems  All other systems reviewed and are negative.      Objective:   Physical Exam  Constitutional: She is oriented to person, place, and time. No distress.  HENT:  Mouth/Throat: Oropharynx is clear and moist.  Eyes: No scleral icterus.  Neck: Neck supple.  Cardiovascular: Normal rate, regular rhythm and normal heart sounds.  Pulmonary/Chest: Breath sounds normal. No respiratory distress.  Abdominal: Soft. She exhibits no mass. There is no hepatosplenomegaly. There is tenderness in the suprapubic area. There is no rebound and no CVA tenderness.  Lymphadenopathy:    She has no cervical adenopathy.  Neurological: She is alert and oriented to person, place, and time.  Skin: Skin is warm and dry.  Nursing note and vitals reviewed.      Assessment:     Acute uncomplicated UTI cystitis.  By her history, she has done well with Cipro and she specifically requests Cipro.    Plan:     Risks, benefits, alternatives discussed. New Prescriptions   CIPROFLOXACIN (CIPRO) 500 MG TABLET    Take 1 tablet (500 mg total) by mouth 2 (two) times daily. Take for 7 days.   Follow-up with your primary care doctor in 5-7 days if not improving, or sooner if symptoms become worse. Precautions discussed. Red flags discussed. An After Visit Summary was printed and given to the patient. You have a urinary tract infection.   prescribing Cipro twice a day for 7 days.  Prescription sent to your pharmacy, [facility]. Urine culture sent. Follow up with your primary care physician if not improved in 5 to 7 days. Please read urinary tract infection handout Questions invited and answered. Patient voiced understanding and agreement.

## 2018-04-11 LAB — URINE CULTURE

## 2018-04-12 ENCOUNTER — Telehealth: Payer: Self-pay

## 2018-04-12 NOTE — Telephone Encounter (Signed)
Patient was contacted about lab results and Treatment plan.

## 2018-04-12 NOTE — Progress Notes (Signed)
Please advise patient that urine culture grew out "enterococcus ", a common UTI bacteria.  The Cipro should cure this.  Make sure you take the full prescription of Cipro as prescribed

## 2018-04-19 ENCOUNTER — Ambulatory Visit: Payer: Self-pay | Admitting: Emergency Medicine

## 2018-04-19 VITALS — BP 122/82 | HR 86 | Temp 98.6°F | Resp 12

## 2018-04-19 DIAGNOSIS — M545 Low back pain, unspecified: Secondary | ICD-10-CM

## 2018-04-19 DIAGNOSIS — R300 Dysuria: Secondary | ICD-10-CM

## 2018-04-19 LAB — POCT URINALYSIS DIPSTICK
BILIRUBIN UA: NEGATIVE
Glucose, UA: NEGATIVE
Ketones, UA: NEGATIVE
Leukocytes, UA: NEGATIVE
Nitrite, UA: NEGATIVE
Protein, UA: NEGATIVE
SPEC GRAV UA: 1.015 (ref 1.010–1.025)
Urobilinogen, UA: 0.2 E.U./dL
pH, UA: 6.5 (ref 5.0–8.0)

## 2018-04-19 NOTE — Patient Instructions (Signed)

## 2018-04-19 NOTE — Addendum Note (Signed)
Addended by: Dollene Primrose on: 04/19/2018 03:35 PM   Modules accepted: Level of Service

## 2018-04-19 NOTE — Progress Notes (Signed)
Okay. Patient returns for follow-up following a visit 10 days ago with urinary symptoms.  She was seen with low abdominal pain as well as low back pain.  Urinalysis showed 1+ leukocytes trace hematuria.  Culture grew Enterococcus faecalis sensitive to Cipro.  She took a one-week course of Cipro but symptoms have remained the same.  She continues to be sore in her back with pain on movement.  She continues to have a low abdominal discomfort.  She has urinary frequency but states she does feel like she empties her bladder completely.  She has no dysuria no discharge.  She sees her GYN regularly and is status post hysterectomy and oophorectomy by history.  Patient has low back pain with pain on movement.  She does have a 63-year-old grandchild that she must place in a car seat. Objective. Chest is clear. Abdomen reveals tenderness in suprapubic area.  There is no upper abdominal discomfort.  There is no CVA tenderness. Back.  There is tenderness over the lower lumbar spine with decreased range of motion and pain with twisting to the right and left. Urinalysis has trace blood negative leukocytes negative nitrite. Assessment. Back pain appears to be musculoskeletal.  Her urine today is negative for white cells.  Urine culture was sent.  Referral will be made to urology. Plan. Advised 1 Aleve twice a day if needed for back discomfort.   Advised stretches to do for her back. I will call with culture results. Urology referral made.  To evaluate for recent urinary tract infection and history of recurrent urinary tract infections. Recheck 1 week.  She does not have a PCP but sees her GYN for routine care.

## 2018-04-21 LAB — URINALYSIS, ROUTINE W REFLEX MICROSCOPIC
Bilirubin, UA: NEGATIVE
Glucose, UA: NEGATIVE
Ketones, UA: NEGATIVE
LEUKOCYTES UA: NEGATIVE
Nitrite, UA: NEGATIVE
PROTEIN UA: NEGATIVE
RBC, UA: NEGATIVE
Specific Gravity, UA: 1.012 (ref 1.005–1.030)
Urobilinogen, Ur: 0.2 mg/dL (ref 0.2–1.0)
pH, UA: 6 (ref 5.0–7.5)

## 2018-04-21 LAB — URINE CULTURE: ORGANISM ID, BACTERIA: NO GROWTH

## 2018-04-26 ENCOUNTER — Ambulatory Visit: Payer: Self-pay | Admitting: Urology

## 2018-05-02 ENCOUNTER — Encounter: Payer: Self-pay | Admitting: Urology

## 2018-05-02 ENCOUNTER — Ambulatory Visit (INDEPENDENT_AMBULATORY_CARE_PROVIDER_SITE_OTHER): Payer: Managed Care, Other (non HMO) | Admitting: Urology

## 2018-05-02 VITALS — BP 130/82 | Ht 62.0 in | Wt 134.4 lb

## 2018-05-02 DIAGNOSIS — R3129 Other microscopic hematuria: Secondary | ICD-10-CM | POA: Diagnosis not present

## 2018-05-02 DIAGNOSIS — R300 Dysuria: Secondary | ICD-10-CM

## 2018-05-02 DIAGNOSIS — R350 Frequency of micturition: Secondary | ICD-10-CM | POA: Diagnosis not present

## 2018-05-02 DIAGNOSIS — N952 Postmenopausal atrophic vaginitis: Secondary | ICD-10-CM | POA: Diagnosis not present

## 2018-05-02 LAB — URINALYSIS, COMPLETE
BILIRUBIN UA: NEGATIVE
Glucose, UA: NEGATIVE
KETONES UA: NEGATIVE
Leukocytes, UA: NEGATIVE
NITRITE UA: NEGATIVE
Protein, UA: NEGATIVE
UUROB: 0.2 mg/dL (ref 0.2–1.0)
pH, UA: 5.5 (ref 5.0–7.5)

## 2018-05-02 LAB — MICROSCOPIC EXAMINATION
Bacteria, UA: NONE SEEN
RBC MICROSCOPIC, UA: NONE SEEN /HPF (ref 0–2)
WBC UA: NONE SEEN /HPF (ref 0–5)

## 2018-05-02 LAB — BLADDER SCAN AMB NON-IMAGING: Scan Result: 0

## 2018-05-02 MED ORDER — ESTROGENS, CONJUGATED 0.625 MG/GM VA CREA: TOPICAL_CREAM | VAGINAL | 12 refills | Status: DC

## 2018-05-02 MED ORDER — ESTRADIOL 0.1 MG/GM VA CREA: TOPICAL_CREAM | VAGINAL | 12 refills | Status: DC

## 2018-05-02 NOTE — Progress Notes (Signed)
05/02/2018 8:40 PM   Darlene Yang [birth year] [id number]  Referring provider: No referring provider defined for this encounter.  No chief complaint on file.   HPI: Patient is a 62 -year-old Caucasian female who is referred to [place] by Dr. Collene Gobble for recurrent urinary tract infections.  Patient states that she has had three urinary tract infections over the last year.  Reviewing her records,  she has had one documented positive urine culture.  Positive for enterococcus faecalis in October 2019 Urine culture was negative in March 2018, but there were 0-5 RBCs on urinalysis  Her symptoms with a urinary tract infection consist of fatigue, LBP and abdominal cramping.    She denies dysuria, gross hematuria, suprapubic pain, abdominal pain or flank pain associated with UTI's.  She has not had any fevers, chills, nausea or vomiting associated with UTI's.   She does not have a history of nephrolithiasis, GU surgery or GU trauma.   She is sexually active.  She has not noted a correlation with her urinary tract infections and sexual intercourse.    She is postmenopausal.   She denies constipation and/or diarrhea.   She does engage in good perineal hygiene. She does not take tub baths.   She is drinking 64 ounces of water daily.   No coffee, soda or tea.  Some milk.  No energy drinks.  Started cranberry juice recently.  No alcohol.    She has baseline frequency and nocturia.  Patient denies any gross hematuria, dysuria or suprapubic/flank pain.  Patient denies any fevers, chills, nausea or vomiting.   Her UA is positive for calcium oxalate crystals.  Her PVR is 0 mL.    She is asymptomatic today.    PMH: Past Medical History:  Diagnosis Date  . Abnormal Pap smear of cervix    s/p LEEP  . Cervical dysplasia 1999    Surgical History: Past Surgical History:  Procedure Laterality Date  . BREAST BIOPSY Left 1999   benign needle bx per pt  . LEEP  1999  . TOTAL VAGINAL  HYSTERECTOMY  1999   TVHBSO due to cysts/CPP [facility] Medications:  Allergies as of 05/02/2018      Reactions   Prednisone    Codeine Nausea Only      Medication List        Accurate as of 05/02/18 11:59 PM. Always use your most recent med list.          cholecalciferol 1000 units tablet Commonly known as:  VITAMIN D Take 1,000 Units by mouth daily.   conjugated estrogens vaginal cream Commonly known as:  PREMARIN Apply 0.5mg  (pea-sized amount)  just inside the vaginal introitus with a finger-tip on  Monday, Wednesday and Friday nights.   estradiol 0.1 MG/GM vaginal cream Commonly known as:  ESTRACE Apply 0.5mg  (pea-sized amount)  just inside the vaginal introitus with a finger-tip on Monday, Wednesday and Friday nights.   loratadine 10 MG tablet Commonly known as:  CLARITIN Take 10 mg by mouth daily.   multivitamin tablet Take 1 tablet by mouth daily.       Allergies:  Allergies  Allergen Reactions  . Prednisone   . Codeine Nausea Only    Family History: Family History  Problem Relation Age of Onset  . Breast cancer Neg Hx     Social History:  reports that she has never smoked. She has never used smokeless tobacco. She reports that she does  not drink alcohol or use drugs.  ROS: UROLOGY Frequent Urination?: Yes Hard to postpone urination?: No Burning/pain with urination?: No Get up at night to urinate?: Yes Leakage of urine?: No Urine stream starts and stops?: No Trouble starting stream?: No Do you have to strain to urinate?: No Blood in urine?: No Urinary tract infection?: Yes Sexually transmitted disease?: No Injury to kidneys or bladder?: No Painful intercourse?: No Weak stream?: No Currently pregnant?: No Vaginal bleeding?: No Last menstrual period?: n  Gastrointestinal Nausea?: No Vomiting?: No Indigestion/heartburn?: No Diarrhea?: No Constipation?: No  Constitutional Fever: No Night sweats?: No Weight loss?:  No Fatigue?: No  Skin Skin rash/lesions?: No Itching?: No  Eyes Blurred vision?: No Double vision?: No  Ears/Nose/Throat Sore throat?: No Sinus problems?: No  Hematologic/Lymphatic Swollen glands?: No Easy bruising?: No  Cardiovascular Leg swelling?: No Chest pain?: No  Respiratory Cough?: No Shortness of breath?: No  Endocrine Excessive thirst?: No  Musculoskeletal Back pain?: No Joint pain?: No  Neurological Headaches?: No Dizziness?: No  Psychologic Depression?: No Anxiety?: No  Physical Exam: BP 130/82 (BP Location: Left Arm, Patient Position: Sitting, Cuff Size: Normal)   Ht 5\' 2"  (1.575 m)   Wt 134 lb 6.4 oz (61 kg)   BMI 24.58 kg/m   Constitutional:  Well nourished. Alert and oriented, No acute distress. HEENT: [place] AT, moist mucus membranes.  Trachea midline, no masses. Cardiovascular: No clubbing, cyanosis, or edema. Respiratory: Normal respiratory effort, no increased work of breathing. GI: Abdomen is soft, non tender, non distended, no abdominal masses. Liver and spleen not palpable.  No hernias appreciated.  Stool sample for occult testing is not indicated.   GU: No CVA tenderness.  No bladder fullness or masses.  Atrophic external genitalia, normal pubic hair distribution, no lesions.  Normal urethral meatus, no lesions, no prolapse, no discharge.   Small urethral caruncle noted.  No bladder fullness, tenderness or masses.  Pale vagina mucosa, poor estrogen effect, no discharge, no lesions, fari pelvic support, grade I cystocele no  rectocele noted.  Cervix, uterus and adnexa are surgically absent.  Anus and perineum are without rashes or lesions.    Skin: No rashes, bruises or suspicious lesions. Lymph: No cervical or inguinal adenopathy. Neurologic: Grossly intact, no focal deficits, moving all 4 extremities. Psychiatric: Normal mood and affect.  Laboratory Data: Lab Results  Component Value Date   WBC 4.6 10/09/2016   HGB 13.9 10/09/2016    HCT 41.5 10/09/2016   MCV 84 10/09/2016   PLT 190 10/09/2016    Lab Results  Component Value Date   CREATININE 0.70 05/16/2018    No results found for: PSA  No results found for: TESTOSTERONE  Lab Results  Component Value Date   HGBA1C 5.6 02/04/2016    Lab Results  Component Value Date   TSH 4.820 (H) 10/09/2016       Component Value Date/Time   CHOL 243 (H) 09/27/2017 0845   HDL 60 09/27/2017 0845   CHOLHDL 4.1 09/27/2017 0845   LDLCALC 162 (H) 09/27/2017 0845    Lab Results  Component Value Date   AST 17 10/09/2016   Lab Results  Component Value Date   ALT 15 10/09/2016   No components found for: ALKALINEPHOPHATASE No components found for: BILIRUBINTOTAL  No results found for: ESTRADIOL  Urinalysis    Component Value Date/Time   COLORURINE STRAW (A) 09/11/2017 1146   APPEARANCEUR Clear 05/02/2018 1509   LABSPEC 1.010 09/11/2017 1146   PHURINE 7.0 09/11/2017  1146   GLUCOSEU Negative 05/02/2018 1509   HGBUR TRACE (A) 09/11/2017 1146   BILIRUBINUR Negative 05/02/2018 1509   KETONESUR NEGATIVE 09/11/2017 1146   PROTEINUR Negative 05/02/2018 1509   PROTEINUR NEGATIVE 09/11/2017 1146   UROBILINOGEN 0.2 04/19/2018 1315   NITRITE Negative 05/02/2018 1509   NITRITE NEGATIVE 09/11/2017 1146   LEUKOCYTESUR Negative 05/02/2018 1509    I have reviewed the labs.   Pertinent Imaging: Results for JALEEAH, SLIGHT" (MRN [id number]) as of 05/21/2018 20:48  Ref. Range 05/02/2018 15:30  Scan Result Unknown 0    Assessment & Plan:    1. Microscopic hematuria I explained to the patient that there are a number of causes that can be associated with blood in the urine, such as stones, damage to the urinary tract and/or cancer. At this time, I felt that the patient warranted further urologic evaluation with 3 or greater RBC's/hpf on microscopic evaluation of the urine.  The AUA guidelines state that a CT urogram is the preferred imaging study to evaluate  hematuria. I explained to the patient that a contrast material will be injected into a vein and that in rare instances, an allergic reaction can result and may even life threatening   The patient denies any allergies to contrast, iodine and/or seafood and is not taking metformin Following the imaging study,  I've recommended a cystoscopy. I described how this is performed, typically in an office setting with a flexible cystoscope. We described the risks, benefits, and possible side effects, the most common of which is a minor amount of blood in the urine and/or burning which usually resolves in 24 to 48 hours.   The patient had the opportunity to ask questions which were answered. Based upon this discussion, the patient is willing to proceed. Therefore, I've ordered: a CT Urogram and cystoscopy. The patient will return following all of the above for discussion of the results.  UA Urine culture  2. Frequency - Urinalysis, Complete - Bladder Scan (Post Void Residual) in office - will reassess once hematuria workup is complete   3. Vaginal atrophy Patient was given a sample of vaginal estrogen cream (Premarin vaginal cream) and instructed to apply 0.5mg  (pea-sized amount)  just inside the vaginal introitus with a finger-tip on Monday, Wednesday and Friday nights.  I explained to the patient that vaginally administered estrogen, which causes only a slight increase in the blood estrogen levels, have fewer contraindications and adverse systemic effects that oral HT. I have also given prescriptions for the Estrace cream and Premarin cream, so that the patient may carry them to the pharmacy to see which one of the branded creams would be most economical for her.  If she finds both medications cost prohibitive, she is instructed to call the office.  We can then call in a compounded vaginal estrogen cream for the patient that may be more affordable.   She will follow up in three months for an exam.      Return for CT Urogram report and cystoscopy.  These notes generated with voice recognition software. I apologize for typographical errors.  Michiel Cowboy, PA-C  [facility] [address]  [address] [place], [place] [address] [phone]

## 2018-05-02 NOTE — Patient Instructions (Addendum)
Hematuria, Adult Hematuria is blood in your urine. It can be caused by a bladder infection, kidney infection, prostate infection, kidney stone, or cancer of your urinary tract. Infections can usually be treated with medicine, and a kidney stone usually will pass through your urine. If neither of these is the cause of your hematuria, further workup to find out the reason may be needed. It is very important that you tell your health care provider about any blood you see in your urine, even if the blood stops without treatment or happens without causing pain. Blood in your urine that happens and then stops and then happens again can be a symptom of a very serious condition. Also, pain is not a symptom in the initial stages of many urinary cancers. Follow these instructions at home:  Drink lots of fluid, 3-4 quarts a day. If you have been diagnosed with an infection, cranberry juice is especially recommended, in addition to large amounts of water.  Avoid caffeine, tea, and carbonated beverages because they tend to irritate the bladder.  Avoid alcohol because it may irritate the prostate.  Take all medicines as directed by your health care provider.  If you were prescribed an antibiotic medicine, finish it all even if you start to feel better.  If you have been diagnosed with a kidney stone, follow your health care provider's instructions regarding straining your urine to catch the stone.  Empty your bladder often. Avoid holding urine for long periods of time.  After a bowel movement, women should cleanse front to back. Use each tissue only once.  Empty your bladder before and after sexual intercourse if you are a female. Contact a health care provider if:  You develop back pain.  You have a fever.  You have a feeling of sickness in your stomach (nausea) or vomiting.  Your symptoms are not better in 3 days. Return sooner if you are getting worse. Get help right away if:  You develop  severe vomiting and are unable to keep the medicine down.  You develop severe back or abdominal pain despite taking your medicines.  You begin passing a large amount of blood or clots in your urine.  You feel extremely weak or faint, or you pass out. This information is not intended to replace advice given to you by your health care provider. Make sure you discuss any questions you have with your health care provider. Document Released: 06/19/2005 Document Revised: 11/25/2015 Document Reviewed: 02/17/2013 Elsevier Interactive Patient Education  2017 [employer]  CT Scan A CT scan (computed tomography scan) is an imaging scan. It uses X-rays and a computer to make detailed pictures of different areas inside the body. A CT scan can give more information than a regular X-ray exam. A CT scan provides data about internal organs, soft tissue structures, blood vessels, and bones. In this procedure, the pictures will be taken in a large machine that has an opening (CT scanner). Tell a health care provider about:  Any allergies you have.  All medicines you are taking, including vitamins, herbs, eye drops, creams, and over-the-counter medicines.  Any blood disorders you have.  Any surgeries you have had.  Any medical conditions you have.  Whether you are pregnant or may be pregnant. What are the risks? Generally, this is a safe procedure. However, problems may occur, including:  An allergic reaction to dyes.  Development of cancer from excessive exposure to radiation from multiple CT scans. This is rare.  What happens before   the procedure? Staying hydrated Follow instructions from your health care provider about hydration, which may include:  Up to 2 hours before the procedure - you may continue to drink clear liquids, such as water, clear fruit juice, black coffee, and plain tea.  Eating and drinking restrictions Follow instructions from your health care provider about eating and  drinking, which may include:  24 hours before the procedure - stop drinking caffeinated beverages, such as energy drinks, tea, soda, coffee, and hot chocolate.  8 hours before the procedure - stop eating heavy meals or foods such as meat, fried foods, or fatty foods.  6 hours before the procedure - stop eating light meals or foods, such as toast or cereal.  6 hours before the procedure - stop drinking milk or drinks that contain milk.  2 hours before the procedure - stop drinking clear liquids.  General instructions  Remove any jewelry.  Ask your health care provider about changing or stopping your regular medicines. This is especially important if you are taking diabetes medicines or blood thinners. What happens during the procedure?  You will lie on a table with your arms above your head.  An IV tube may be inserted into one of your veins.  The contrast dye may be injected into the IV tube. You may feel warm or have a metallic taste in your mouth.  The table you will be lying on will move into the CT scanner.  You will be able to see, hear, and talk to the person running the machine while you are in it. Follow that person's instructions.  The CT scanner will move around you to take pictures. Do not move while it is scanning. Staying still helps the scanner to get a good image.  When the best possible pictures have been taken, the machine will be turned off. The table will be moved out of the machine.  The IV tube will be removed. The procedure may vary among health care providers and hospitals. What happens after the procedure?  It is up to you to get the results of your procedure. Ask your health care provider, or the department that is doing the procedure, when your results will be ready. Summary  A CT scan is an imaging scan.  A CT scan uses X-rays and a computer to make detailed pictures of different areas of your body.  Follow instructions from your health care  provider about eating and drinking before the procedure.  You will be able to see, hear, and talk to the person running the machine while you are in it. Follow that person's instructions. This information is not intended to replace advice given to you by your health care provider. Make sure you discuss any questions you have with your health care provider. Document Released: 07/27/2004 Document Revised: 07/22/2016 Document Reviewed: 07/22/2016 Elsevier Interactive Patient Education  2018 Else[employer] Inc.  Cystoscopy Cystoscopy is a procedure that is used to help diagnose and sometimes treat conditions that affect that lower urinary tract. The lower urinary tract includes the bladder and the tube that drains urine from the bladder out of the body (urethra). Cystoscopy is performed with a thin, tube-shaped instrument with a light and camera at the end (cystoscope). The cystoscope may be hard (rigid) or flexible, depending on the goal of the procedure.The cystoscope is inserted through the urethra, into the bladder. Cystoscopy may be recommended if you have:  Urinary tractinfections that keep coming back (recurring).  Blood in the urine (hematuria).    Loss of bladder control (urinary incontinence) or an overactive bladder.  Unusual cells found in a urine sample.  A blockage in the urethra.  Painful urination.  An abnormality in the bladder found during an intravenous pyelogram (IVP) or CT scan.  Cystoscopy may also be done to remove a sample of tissue to be examined under a microscope (biopsy). Tell a health care provider about:  Any allergies you have.  All medicines you are taking, including vitamins, herbs, eye drops, creams, and over-the-counter medicines.  Any problems you or family members have had with anesthetic medicines.  Any blood disorders you have.  Any surgeries you have had.  Any medical conditions you have.  Whether you are pregnant or may be pregnant. What are the  risks? Generally, this is a safe procedure. However, problems may occur, including:  Infection.  Bleeding.  Allergic reactions to medicines.  Damage to other structures or organs.  What happens before the procedure?  Ask your health care provider about: ? Changing or stopping your regular medicines. This is especially important if you are taking diabetes medicines or blood thinners. ? Taking medicines such as aspirin and ibuprofen. These medicines can thin your blood. Do not take these medicines before your procedure if your health care provider instructs you not to.  Follow instructions from your health care provider about eating or drinking restrictions.  You may be given antibiotic medicine to help prevent infection.  You may have an exam or testing, such as X-rays of the bladder, urethra, or kidneys.  You may have urine tests to check for signs of infection.  Plan to have someone take you home after the procedure. What happens during the procedure?  To reduce your risk of infection,your health care team will wash or sanitize their hands.  You will be given one or more of the following: ? A medicine to help you relax (sedative). ? A medicine to numb the area (local anesthetic).  The area around the opening of your urethra will be cleaned.  The cystoscope will be passed through your urethra into your bladder.  Germ-free (sterile)fluid will flow through the cystoscope to fill your bladder. The fluid will stretch your bladder so that your surgeon can clearly examine your bladder walls.  The cystoscope will be removed and your bladder will be emptied. The procedure may vary among health care providers and hospitals. What happens after the procedure?  You may have some soreness or pain in your abdomen and urethra. Medicines will be available to help you.  You may have some blood in your urine.  Do not drive for 24 hours if you received a sedative. This information is  not intended to replace advice given to you by your health care provider. Make sure you discuss any questions you have with your health care provider. Document Released: 06/16/2000 Document Revised: 10/28/2015 Document Reviewed: 05/06/2015 Elsevier Interactive Patient Education  [employer].  I have given you two prescriptions for a vaginal estrogen cream.  Estrace and Premarin.  Please take these to your pharmacy and see which one your insurance covers.  If both are too expensive, please call the office at [phone] for an alternative.  You are given a sample of vaginal estrogen cream Premarin and instructed to apply 0.5mg  (pea-sized amount)  just inside the vaginal introitus with a finger-tip on Monday, Wednesday and Friday nights,

## 2018-05-16 ENCOUNTER — Ambulatory Visit
Admission: RE | Admit: 2018-05-16 | Discharge: 2018-05-16 | Disposition: A | Discharge status: Home / Self Care | Payer: Managed Care, Other (non HMO) | Source: Ambulatory Visit | Attending: Urology | Admitting: Urology

## 2018-05-16 DIAGNOSIS — I700 Atherosclerosis of aorta: Secondary | ICD-10-CM | POA: Insufficient documentation

## 2018-05-16 DIAGNOSIS — R3129 Other microscopic hematuria: Secondary | ICD-10-CM | POA: Diagnosis present

## 2018-05-16 DIAGNOSIS — N1330 Unspecified hydronephrosis: Secondary | ICD-10-CM | POA: Diagnosis not present

## 2018-05-16 LAB — POCT I-STAT CREATININE: CREATININE: 0.7 mg/dL (ref 0.44–1.00)

## 2018-05-16 MED ORDER — IOPAMIDOL (ISOVUE-300) INJECTION 61%
125.0000 mL | Freq: Once | INTRAVENOUS | Status: AC | PRN
  Administered 2018-05-16: 125 mL via INTRAVENOUS

## 2018-05-23 ENCOUNTER — Ambulatory Visit (INDEPENDENT_AMBULATORY_CARE_PROVIDER_SITE_OTHER): Payer: Managed Care, Other (non HMO) | Admitting: Urology

## 2018-05-23 ENCOUNTER — Encounter: Payer: Self-pay | Admitting: Urology

## 2018-05-23 VITALS — BP 148/87 | HR 85 | Ht 62.0 in | Wt 134.0 lb

## 2018-05-23 DIAGNOSIS — R3129 Other microscopic hematuria: Secondary | ICD-10-CM

## 2018-05-23 DIAGNOSIS — N1330 Unspecified hydronephrosis: Secondary | ICD-10-CM

## 2018-05-23 LAB — URINALYSIS, COMPLETE
BILIRUBIN UA: NEGATIVE
Glucose, UA: NEGATIVE
KETONES UA: NEGATIVE
LEUKOCYTES UA: NEGATIVE
Nitrite, UA: NEGATIVE
Protein, UA: NEGATIVE
SPEC GRAV UA: 1.02 (ref 1.005–1.030)
UUROB: 0.2 mg/dL (ref 0.2–1.0)
pH, UA: 5 (ref 5.0–7.5)

## 2018-05-23 LAB — MICROSCOPIC EXAMINATION: RBC, UA: NONE SEEN /hpf (ref 0–2)

## 2018-05-23 MED ORDER — LIDOCAINE HCL URETHRAL/MUCOSAL 2 % EX GEL: 1.0000 "application " | Freq: Once | CUTANEOUS | Status: DC

## 2018-05-26 NOTE — Progress Notes (Signed)
   05/26/18  CC:  Chief Complaint  Patient presents with  . Cysto    HPI: Refer to Carollee HerterShannon McGowan's note 05/02/2018.  Her urinalysis showed 0-5 RBCs which is the default negative so I am not convinced this is clinically significant microhematuria.  CT urogram showed mild hydronephrosis without filling defect or calculus.  Blood pressure (!) 148/87, pulse 85, height 5\' 2"  (1.575 m), weight 134 lb (60.8 kg). NED. A&Ox3.   No respiratory distress   Abd soft, NT, ND Atrophic external genitalia with patent urethral meatus  Cystoscopy Procedure Note  Patient identification was confirmed, informed consent was obtained, and patient was prepped using Betadine solution.  Lidocaine jelly was administered per urethral meatus.    Procedure: - Flexible cystoscope introduced, without any difficulty.   - Thorough search of the bladder revealed:    normal urethral meatus    normal urothelium    no stones    no ulcers     no tumors    no urethral polyps    no trabeculation  - Ureteral orifices were normal in position and appearance.  Post-Procedure: - Patient tolerated the procedure well  Assessment/ Plan: No significant abnormalities on cystoscopy.  Mild right hydronephrosis on CTU.  She is asymptomatic.  Management options were discussed including cystoscopy right retrograde pyelogram and ureteroscopy versus observation.  She has elected the latter and will schedule a follow-up CTU in 3 months.   Riki AltesScott C Stoioff, MD

## 2018-07-01 ENCOUNTER — Ambulatory Visit: Payer: Self-pay | Admitting: Emergency Medicine

## 2018-07-01 VITALS — BP 122/78 | HR 88 | Temp 99.0°F | Resp 14

## 2018-07-01 DIAGNOSIS — J101 Influenza due to other identified influenza virus with other respiratory manifestations: Secondary | ICD-10-CM

## 2018-07-01 DIAGNOSIS — R059 Cough, unspecified: Secondary | ICD-10-CM

## 2018-07-01 DIAGNOSIS — R05 Cough: Secondary | ICD-10-CM

## 2018-07-01 LAB — POCT INFLUENZA A/B
INFLUENZA A, POC: POSITIVE — AB
INFLUENZA B, POC: NEGATIVE

## 2018-07-01 MED ORDER — OSELTAMIVIR PHOSPHATE 75 MG PO CAPS: 75.0000 mg | ORAL_CAPSULE | Freq: Two times a day (BID) | ORAL | 0 refills | Status: DC

## 2018-07-01 MED ORDER — BENZONATATE 100 MG PO CAPS: 100.0000 mg | ORAL_CAPSULE | Freq: Three times a day (TID) | ORAL | 0 refills | Status: DC | PRN

## 2018-07-01 NOTE — Progress Notes (Signed)
Subjective. Patient had onset of symptoms 1 week ago.  She has had headache, myalgias, and fever.  She states the true symptoms began on Saturday.  She said she felt like she had an upper respiratory type infection last week but had to be with her mom who passed away on Friday.  She has not been getting enough rest. Objective: Today's Vitals   07/01/18 0914  BP: 122/78  Pulse: 88  Resp: 14  Temp: 99 F (37.2 C)  TempSrc: Oral  SpO2: 97%   There is no height or weight on file to calculate BMI.  Alert and cooperative in no distress. TMs clear. Throat moist no redness Chest clear to auscultation and percussion. Heart regular rate and rhythm Skin no rash. Flu test positive influenza A. Assessment: Not clear duration of symptoms in this patient.  Her primary complaint is of myalgias headache and fever associated with a cough which at times has been productive. Plan: Tamiflu 75 twice daily for 5 days for patient and husband.  The symptoms may have begun on Saturday which would put her in the window to go ahead and be treated. Tessalon Perles. Patient advised she is contagious. Follow-up with primary care provider if no better in 2 to 3 days. Husband also given prescription for Tamiflu.

## 2018-07-01 NOTE — Patient Instructions (Signed)
Take medication as instructed. Follow-up with your primary care provider if no better in 2 to 3 days or worsening of symptoms. Please wear a mask when around other people.    Influenza, Adult Influenza is also called "the flu." It is an infection in the lungs, nose, and throat (respiratory tract). It is caused by a virus. The flu causes symptoms that are similar to symptoms of a cold. It also causes a high fever and body aches. The flu spreads easily from person to person (is contagious). Getting a flu shot (influenza vaccination) every year is the best way to prevent the flu. What are the causes? This condition is caused by the influenza virus. You can get the virus by:  Breathing in droplets that are in the air from the cough or sneeze of a person who has the virus.  Touching something that has the virus on it (is contaminated) and then touching your mouth, nose, or eyes. What increases the risk? Certain things may make you more likely to get the flu. These include:  Not washing your hands often.  Having close contact with many people during cold and flu season.  Touching your mouth, eyes, or nose without first washing your hands.  Not getting a flu shot every year. You may have a higher risk for the flu, along with serious problems such as a lung infection (pneumonia), if you:  Are older than 65.  Are pregnant.  Have a weakened disease-fighting system (immune system) because of a disease or taking certain medicines.  Have a long-term (chronic) illness, such as: ? Heart, kidney, or lung disease. ? Diabetes. ? Asthma.  Have a liver disorder.  Are very overweight (morbidly obese).  Have anemia. This is a condition that affects your red blood cells. What are the signs or symptoms? Symptoms usually begin suddenly and last 4-14 days. They may include:  Fever and chills.  Headaches, body aches, or muscle aches.  Sore throat.  Cough.  Runny or stuffy (congested)  nose.  Chest discomfort.  Not wanting to eat as much as normal (poor appetite).  Weakness or feeling tired (fatigue).  Dizziness.  Feeling sick to your stomach (nauseous) or throwing up (vomiting). How is this treated? If the flu is found early, you can be treated with medicine that can help reduce how bad the illness is and how long it lasts (antiviral medicine). This may be given by mouth (orally) or through an IV tube. Taking care of yourself at home can help your symptoms get better. Your doctor may suggest:  Taking over-the-counter medicines.  Drinking plenty of fluids. The flu often goes away on its own. If you have very bad symptoms or other problems, you may be treated in a hospital. Follow these instructions at home:     Activity  Rest as needed. Get plenty of sleep.  Stay home from work or school as told by your doctor. ? Do not leave home until you do not have a fever for 24 hours without taking medicine. ? Leave home only to visit your doctor. Eating and drinking  Take an ORS (oral rehydration solution). This is a drink that is sold at pharmacies and stores.  Drink enough fluid to keep your pee (urine) pale yellow.  Drink clear fluids in small amounts as you are able. Clear fluids include: ? Water. ? Ice chips. ? Fruit juice that has water added (diluted fruit juice). ? Low-calorie sports drinks.  Eat bland, easy-to-digest foods in small  amounts as you are able. These foods include: ? Bananas. ? Applesauce. ? Rice. ? Lean meats. ? Toast. ? Crackers.  Do not eat or drink: ? Fluids that have a lot of sugar or caffeine. ? Alcohol. ? Spicy or fatty foods. General instructions  Take over-the-counter and prescription medicines only as told by your doctor.  Use a cool mist humidifier to add moisture to the air in your home. This can make it easier for you to breathe.  Cover your mouth and nose when you cough or sneeze.  Wash your hands with soap and  water often, especially after you cough or sneeze. If you cannot use soap and water, use alcohol-based hand sanitizer.  Keep all follow-up visits as told by your doctor. This is important. How is this prevented?   Get a flu shot every year. You may get the flu shot in late summer, fall, or winter. Ask your doctor when you should get your flu shot.  Avoid contact with people who are sick during fall and winter (cold and flu season). Contact a doctor if:  You get new symptoms.  You have: ? Chest pain. ? Watery poop (diarrhea). ? A fever.  Your cough gets worse.  You start to have more mucus.  You feel sick to your stomach.  You throw up. Get help right away if you:  Have shortness of breath.  Have trouble breathing.  Have skin or nails that turn a bluish color.  Have very bad pain or stiffness in your neck.  Get a sudden headache.  Get sudden pain in your face or ear.  Cannot eat or drink without throwing up. Summary  Influenza ("the flu") is an infection in the lungs, nose, and throat. It is caused by a virus.  Take over-the-counter and prescription medicines only as told by your doctor.  Getting a flu shot every year is the best way to avoid getting the flu. This information is not intended to replace advice given to you by your health care provider. Make sure you discuss any questions you have with your health care provider. Document Released: 03/28/2008 Document Revised: 12/05/2017 Document Reviewed: 12/05/2017 Elsevier Interactive Patient Education  2019 [employer].

## 2018-08-19 ENCOUNTER — Ambulatory Visit: Payer: Self-pay | Admitting: Adult Health

## 2018-08-19 ENCOUNTER — Other Ambulatory Visit: Payer: Self-pay

## 2018-08-19 VITALS — BP 126/70 | HR 82 | Temp 98.4°F | Resp 12

## 2018-08-19 DIAGNOSIS — M545 Low back pain, unspecified: Secondary | ICD-10-CM

## 2018-08-19 DIAGNOSIS — N1330 Unspecified hydronephrosis: Secondary | ICD-10-CM

## 2018-08-19 LAB — POCT URINALYSIS DIPSTICK
BILIRUBIN UA: NEGATIVE
Blood, UA: NEGATIVE
Glucose, UA: NEGATIVE
KETONES UA: NEGATIVE
Leukocytes, UA: NEGATIVE
NITRITE UA: NEGATIVE
PROTEIN UA: NEGATIVE
Urobilinogen, UA: 0.2 E.U./dL
pH, UA: 6 (ref 5.0–8.0)

## 2018-08-19 NOTE — Progress Notes (Addendum)
[facility]  Subjective:     Patient ID: Darlene Yang, female   DOB: 1956-05-06, 63 y.o.   MRN: [id number]  Blood pressure 126/70, pulse 82, temperature 98.4 F (36.9 C), temperature source Oral, resp. rate 12, SpO2 98 %. Patient is a 63 year old female in no acute distress who comes to the office for complaints of  Bilateral lower back pain x 4 days he reports, however with further questioning she states that possibly her back pain never went away from 04/09/2018 when she saw Dr. Cleta Alberts in this clinic.  He reports he was supposed to follow-up with urology for a repeat CT scan, however she states "I am still paying for that scan and I am going to have to wait"  Patient scheduled appointment for complaints of urinary tract symptoms today.  However upon questioning patient denies any dysuria, frequency, hesitancy or any other urinary tract symptoms.  She reports that bilateral lower back pain has been her only symptom in the past of a urinary tract infection.   She denies any abdominal pain.  She has had mild intermittent lower pelvic pain over the last 1 to 2 months.  She reports she sees her gynecologist regularly and has had a full hysterectomy.  Patient reports that she has been working today and needs to return to work this afternoon.  She denies any difficulty performing her job duties today.  She reports good appetite.  Patient  denies any fever, body aches,chills, rash, chest pain, shortness of breath, nausea, vomiting, or diarrhea.   Reports that she is urinating normally and denies any hematuria.  She reports regular bowel movements and denies any constipation.  She reports her lower back pain has been intermittent x4 days and rates pain 5 out of 10 on pain scale.  Dr. Cleta Alberts referred to urology in October 2019 after she was treated for a urinary tract infection and returned with continued microscopic hematuria and bilateral low back pain without  sciatica and patient has been seen last visit was in November 2019.  He has had cystoscopy done and was diagnosed with unspecified hydronephrosis.  Patient was to have followed up in 3 months.  Patient was treated with 1 week of Aleve per Dr. Andee Poles, she reports that her pain really never went away from that visit, that visit was 04/19/2018.  Denies any injury, trauma, heavy lifting or bending. Full hysterectomy.  Denies dysuria. Denies any abnormal vaginal symptoms.  She has an appointment with her PCP in April for routine labs and reports that she sees them regularly.  Patient  denies any fever, body aches,chills, rash, chest pain, shortness of breath, nausea, vomiting, or diarrhea.  He also denies any edema.  She denies any difficulty urinating. She does have a history of multiple urinary tract infections.      Allergies  Allergen Reactions  . Prednisone   . Codeine Nausea Only   Patient Active Problem List   Diagnosis Date Noted  . Family history of malignant melanoma 01/27/2018  . Family history of thyroid disease 01/27/2018  . Hyperglycemia 01/27/2018  . Hyperlipidemia, mixed 01/27/2018  . Vitamin D insufficiency 01/27/2018  . Cervical dysplasia 07/03/1997    Current Outpatient Medications:  .  augmented betamethasone dipropionate (DIPROLENE-AF) 0.05 % cream, APPLY THIN FILM TWICE DAILY TO AFFECTED HANDS UNTIL CLEAR, Disp: , Rfl:  .  cholecalciferol (VITAMIN D) 1000 units tablet, Take 1,000 Units by mouth daily., Disp: , Rfl:  .  loratadine (CLARITIN) 10 MG tablet, Take 10 mg by mouth daily., Disp: , Rfl:  .  Multiple Vitamin (MULTIVITAMIN) tablet, Take 1 tablet by mouth daily., Disp: , Rfl:   Current Facility-Administered Medications:  .  lidocaine (XYLOCAINE) 2 % jelly 1 application, 1 application, Urethral, Once, Stoioff, Scott C, MD    Review of Systems  Constitutional: Negative for activity change, appetite change, chills, diaphoresis, fatigue, fever and  unexpected weight change.  HENT: Negative.   Eyes: Negative.   Respiratory: Negative.  Negative for apnea, cough, choking, chest tightness, shortness of breath, wheezing and stridor.   Cardiovascular: Negative for chest pain, palpitations and leg swelling.  Gastrointestinal: Negative for abdominal distention, abdominal pain, anal bleeding, blood in stool, constipation, diarrhea, nausea, rectal pain and vomiting.  Endocrine: Negative.   Genitourinary: Positive for hematuria (history of ). Negative for decreased urine volume, difficulty urinating, dyspareunia, dysuria, enuresis, flank pain, frequency, genital sores, menstrual problem, pelvic pain, urgency, vaginal bleeding, vaginal discharge and vaginal pain.  Musculoskeletal: Positive for back pain (lower bilateral since October 2019 ). Negative for arthralgias, gait problem, joint swelling, myalgias, neck pain and neck stiffness.  Skin: Negative.   Allergic/Immunologic:        -- Prednisone   -- Codeine -- Nausea Only   Neurological: Negative.   Hematological: Negative.   Psychiatric/Behavioral: Negative.        Objective:   Physical Exam Constitutional:      General: She is not in acute distress.    Appearance: Normal appearance. She is not ill-appearing, toxic-appearing or diaphoretic.     Comments: Patient is alert and oriented and responsive to questions Engages in eye contact with provider. Speaks in full sentences without any pauses without any shortness of breath or distress.    HENT:     Head: Normocephalic and atraumatic.     Nose: Nose normal. No congestion or rhinorrhea.     Mouth/Throat:     Mouth: Mucous membranes are moist.     Pharynx: No oropharyngeal exudate or posterior oropharyngeal erythema.  Eyes:     General: No scleral icterus.       Right eye: No discharge.        Left eye: No discharge.     Extraocular Movements: Extraocular movements intact.     Conjunctiva/sclera: Conjunctivae normal.     Pupils:  Pupils are equal, round, and reactive to light.  Neck:     Musculoskeletal: Normal range of motion and neck supple. No neck rigidity or muscular tenderness.  Cardiovascular:     Rate and Rhythm: Normal rate and regular rhythm.     Pulses: Normal pulses.     Heart sounds: Normal heart sounds. No murmur. No friction rub. No gallop.   Pulmonary:     Effort: Pulmonary effort is normal. No respiratory distress.     Breath sounds: Normal breath sounds. No stridor. No wheezing, rhonchi or rales.  Chest:     Chest wall: No tenderness.  Abdominal:     General: Bowel sounds are normal. There is no distension.     Palpations: Abdomen is soft. There is no mass.     Tenderness: There is no abdominal tenderness. There is no right CVA tenderness, left CVA tenderness, guarding or rebound. Negative signs include Murphy's sign, Rovsing's sign, McBurney's sign, psoas sign and obturator sign.     Hernia: No hernia is present.  Genitourinary:    Comments: No gynecology exams done in this office currently and no equipment available.  Patient is aware she will have to see gynecology for any pelvic/vaginal exam and will follow up with gynecology/obgyn as needed. Yearly gynecology pelvic exam recommended. Patient verbalized understanding of instructions and denies any further questions at this time.   Musculoskeletal: Normal range of motion.     Lumbar back: Normal.     Comments: Normal range of motion. No pain with twisting or bending.   Lymphadenopathy:     Cervical: No cervical adenopathy.     Right cervical: No superficial, deep or posterior cervical adenopathy.    Left cervical: No superficial, deep or posterior cervical adenopathy.  Skin:    General: Skin is warm.     Capillary Refill: Capillary refill takes less than 2 seconds.     Coloration: Skin is not jaundiced.     Findings: No erythema or rash.     Nails: There is no clubbing.   Neurological:     General: No focal deficit present.     Mental  Status: She is alert and oriented to person, place, and time.     Gait: Gait normal.     Comments: Patient moves on and off of exam table and in room without difficulty. Gait is normal in hall and in room. Patient is oriented to person place time and situation. Patient answers questions appropriately and engages in conversation.         Assessment:     Acute bilateral low back pain without sciatica - Plan: CBC with Diff, Comprehensive metabolic panel, DG Lumbar Spine Complete  Hydronephrosis, right per CT SCAN 05/2018  - followed by urology  - Plan: POCT Urinalysis Dipstick (CPT 81002), Urine Culture  Results for orders placed or performed in visit on 08/19/18 (from the past 24 hour(s))  POCT Urinalysis Dipstick (CPT 81002)     Status: Abnormal   Collection Time: 08/19/18  2:12 PM  Result Value Ref Range   Color, UA Yellow    Clarity, UA Clear    Glucose, UA Negative Negative   Bilirubin, UA Negative    Ketones, UA Negative    Spec Grav, UA <=1.005 (A) 1.010 - 1.025   Blood, UA Negative    pH, UA 6.0 5.0 - 8.0   Protein, UA Negative Negative   Urobilinogen, UA 0.2 0.2 or 1.0 E.U./dL   Nitrite, UA Negative    Leukocytes, UA Negative Negative   Appearance     Odor     CT SCAN IMPRESSION: 1. Mild right-sided hydroureteronephrosis which extends to the level of the ureteropelvic junction. This is of uncertain etiology. The possibility of distal right ureteral stricture should be considered. 2. No urinary tract calculi. 3. Aortic atherosclerosis. Electronically Signed   By: Trudie Reedaniel  Entrikin M.D.   On: 05/16/2018 12:13     Plan:     Orders Placed This Encounter  Procedures  . Urine Culture  . DG Lumbar Spine Complete    Standing Status:   Future    Standing Expiration Date:   10/18/2019    Order Specific Question:   Reason for Exam (SYMPTOM  OR DIAGNOSIS REQUIRED)    Answer:   bilateral lower back pain without any sciatica/ rule out lumbar involvement / denies any injury     Order Specific Question:   Preferred imaging location?    Answer:   [facility]    Order Specific Question:   Call Results- Best Contact Number?    Answer:   [phone] or office number on file  Order Specific Question:   Radiology Contrast Protocol - do NOT remove file path    Answer:   [email or web address]  . CBC with Diff  . Comprehensive metabolic panel  . POCT Urinalysis Dipstick (CPT 81002)   Gave and reviewed After Visit Summary(AVS) with patient. Patient is advised to read the after visit summary as well and let the provider know if any question, concerns or clarifications are needed.   Given symptoms of bilateral lower back pain will order a lumbar spine x-ray to rule out any other spinal etiology.  Patient is aware that if any signs or symptoms or red flags as discussed develop she will call 911 or proceed to nearest emergency room.  If she has any increasing pain, fever, nausea vomiting or any other symptom that she is not having at today's visit she will be evaluated at higher level of care.  She reports she has not had any labs done, since April 2018.  She reports she has seen her primary care provider York Spaniel, MD.  She reports she has a follow-up appointment with provider in April, this provider suggest that patient schedule an appointment earlier with primary care physician to discuss ongoing issues.  She should be seen by primary care provider within 1 to 2 weeks.  She is aware that only a CBC and CMP will be checked to assess for infection, and/or kidney and liver function and that she should have a full work-up at her primary care office and that this is not a substitute for that visit.  Provider called Dr. Heywood Footman office to schedule an appointment for patient with urology as soon as possible, as she has missed her follow-up CT scan as recommended by urology.  Scheduler referral specialist Marcelino Duster at [facility] urology  reports that patient canceled her appointment on 08/16/2018 and did not reschedule.  Scheduler rescheduled appointment for patient on 08/19/2018 at 3:15 PM.  Dr. Lonna Cobb is on vacation until then and patient is aware that she will have to proceed to the nearest emergency room if symptoms persist or change. Discussed importance of regular follow-up with urology as they recommend.     Commend follow-up with a gynecologist related to lower pelvic pain as well.  Advised patient call the office or your primary care doctor for an appointment if no improvement within 72 hours or if any symptoms change or worsen at any time  Advised ER or urgent Care if after hours or on weekend. Call 911 for emergency symptoms at any time.Patinet verbalized understanding of all instructions given/reviewed and treatment plan and has no further questions or concerns at this time.    Patient verbalized understanding of all instructions given and denies any further questions at this time.

## 2018-08-19 NOTE — Patient Instructions (Addendum)
Appt scheduled with Dr. Virl DiamondStoiff in urology on Thursday 08/22/18 at 3:15pm- please keep this appointment for evaluation. If any pain worsens or changes or new symptoms occur please go to the emergency room or dial 911.  Your urine in the office was negative.  I will check a CBC and CMET today and call you once these results occur. No STAT labs are available in this clinic.  Advised patient call  your primary care doctor for an appointment if no improvement within 72 hours or if any symptoms change or worsen at any time  Advised ER or urgent Care if after hours or on weekend. Call 911 for emergency symptoms at any time.Patinet verbalized understanding of all instructions given/reviewed and treatment plan and has no further questions or concerns at this time.        Per CT SCAN in November Hydronephrosis  Hydronephrosis is the swelling of one or both kidneys due to a blockage that stops urine from flowing out of the body. Kidneys filter waste from the blood and produce urine. This condition can lead to kidney failure and may become life threatening if not treated promptly. What are the causes? Common causes of this condition include:  Problems that occur when a baby is developing in the womb (congenital defect). These can include problems: ? In the kidneys. ? In the tubes that drain urine from the kidneys into the bladder (ureters).  Kidney stones.  Bladder infection.  An enlarged prostate gland.  Scar tissue from a previous surgery or injury.  A blood clot.  A tumor or cyst in the abdomen or pelvis.  Cancer of the prostate, bladder, uterus, ovary, or colon. What are the signs or symptoms? Symptoms of this condition include:  Pain or discomfort in your side (flank).  Pain and swelling in your abdomen.  Nausea and vomiting.  Fever.  Pain when passing urine.  Feelings of urgency when you need to urinate.  Urinating more often than normal. In some cases, you may not have any  symptoms. How is this diagnosed? This condition may be diagnosed based on:  Your symptoms and medical history.  A physical exam.  Blood and urine tests.  Imaging tests, such as an ultrasound, CT scan, or MRI.  A procedure in which a scope is inserted into the urethra and used to view parts of the urinary tract and bladder (cystoscopy). How is this treated? Treatment for this condition depends on where the blockage is, how long it has been there, and what caused it. The goal of treatment is to remove the blockage. Treatment may include:  Antibiotic medicines to treat or prevent infection.  A procedure to place a small, thin tube (stent) into a blocked ureter. The stent will keep the ureter open so that urine can drain through it.  A nonsurgical procedure that crushes kidney stones with shock waves (extracorporeal shock wave lithotripsy).  If kidney failure occurs, treatment may include dialysis or a kidney transplant. Follow these instructions at home:   Take over-the-counter and prescription medicines only as told by your health care provider.  Rest and return to your normal activities as told by your health care provider. Ask your health care provider what activities are safe for you.  Drink enough fluid to keep your urine pale yellow.  If you were prescribed an antibiotic medicine, take it exactly as told by your health care provider. Do not stop taking the antibiotic even if you start to feel better.  Keep all  follow-up visits as told by your health care provider. This is important. Contact a health care provider if:  You continue to have symptoms after treatment.  You develop new symptoms.  Your urine becomes cloudy or bloody.  You have a fever. Get help right away if:  You have severe flank or abdominal pain.  You cannot drink fluids without vomiting. Summary  Hydronephrosis is the swelling of one or both kidneys due to a blockage that stops urine from flowing  out of the body.  Hydronephrosis can lead to kidney failure and may become life threatening if not treated promptly.  The goal of treatment is to treat the cause of the blockage. It may include insertion of stent into a blocked ureter, a procedure to treat kidney stones, and antibiotic medicines.  Follow your health care provider's instructions for taking care of yourself at home, including instructions about drinking fluids, taking medicines, and limiting activities. This information is not intended to replace advice given to you by your health care provider. Make sure you discuss any questions you have with your health care provider. Document Released: 04/16/2007 Document Revised: 06/30/2017 Document Reviewed: 06/30/2017 Elsevier Interactive Patient Education  2019 [employer] Acute Back Pain, Adult Acute back pain is sudden and usually short-lived. It is often caused by an injury to the muscles and tissues in the back. The injury may result from:  A muscle or ligament getting overstretched or torn (strained). Ligaments are tissues that connect bones to each other. Lifting something improperly can cause a back strain.  Wear and tear (degeneration) of the spinal disks. Spinal disks are circular tissue that provides cushioning between the bones of the spine (vertebrae).  Twisting motions, such as while playing sports or doing yard work.  A hit to the back.  Arthritis. You may have a physical exam, lab tests, and imaging tests to find the cause of your pain. Acute back pain usually goes away with rest and home care. Follow these instructions at home: Managing pain, stiffness, and swelling  Take over-the-counter and prescription medicines only as told by your health care provider.  Your health care provider may recommend applying ice during the first 24-48 hours after your pain starts. To do this: ? Put ice in a plastic bag. ? Place a towel between your skin and the bag. ? Leave the  ice on for 20 minutes, 2-3 times a day.  If directed, apply heat to the affected area as often as told by your health care provider. Use the heat source that your health care provider recommends, such as a moist heat pack or a heating pad. ? Place a towel between your skin and the heat source. ? Leave the heat on for 20-30 minutes. ? Remove the heat if your skin turns bright red. This is especially important if you are unable to feel pain, heat, or cold. You have a greater risk of getting burned. Activity   Do not stay in bed. Staying in bed for more than 1-2 days can delay your recovery.  Sit up and stand up straight. Avoid leaning forward when you sit, or hunching over when you stand. ? If you work at a desk, sit close to it so you do not need to lean over. Keep your chin tucked in. Keep your neck drawn back, and keep your elbows bent at a right angle. Your arms should look like the letter "L." ? Sit high and close to the steering wheel when you drive. Add  lower back (lumbar) support to your car seat, if needed.  Take short walks on even surfaces as soon as you are able. Try to increase the length of time you walk each day.  Do not sit, drive, or stand in one place for more than 30 minutes at a time. Sitting or standing for long periods of time can put stress on your back.  Do not drive or use heavy machinery while taking prescription pain medicine.  Use proper lifting techniques. When you bend and lift, use positions that put less stress on your back: ? [place] your knees. ? Keep the load close to your body. ? Avoid twisting.  Exercise regularly as told by your health care provider. Exercising helps your back heal faster and helps prevent back injuries by keeping muscles strong and flexible.  Work with a physical therapist to make a safe exercise program, as recommended by your health care provider. Do any exercises as told by your physical therapist. Lifestyle  Maintain a healthy  weight. Extra weight puts stress on your back and makes it difficult to have good posture.  Avoid activities or situations that make you feel anxious or stressed. Stress and anxiety increase muscle tension and can make back pain worse. Learn ways to manage anxiety and stress, such as through exercise. General instructions  Sleep on a firm mattress in a comfortable position. Try lying on your side with your knees slightly bent. If you lie on your back, put a pillow under your knees.  Follow your treatment plan as told by your health care provider. This may include: ? Cognitive or behavioral therapy. ? Acupuncture or massage therapy. ? Meditation or yoga. Contact a health care provider if:  You have pain that is not relieved with rest or medicine.  You have increasing pain going down into your legs or buttocks.  Your pain does not improve after 2 weeks.  You have pain at night.  You lose weight without trying.  You have a fever or chills. Get help right away if:  You develop new bowel or bladder control problems.  You have unusual weakness or numbness in your arms or legs.  You develop nausea or vomiting.  You develop abdominal pain.  You feel faint. Summary  Acute back pain is sudden and usually short-lived.  Use proper lifting techniques. When you bend and lift, use positions that put less stress on your back.  Take over-the-counter and prescription medicines and apply heat or ice as directed by your health care provider. This information is not intended to replace advice given to you by your health care provider. Make sure you discuss any questions you have with your health care provider. Document Released: 06/19/2005 Document Revised: 01/24/2018 Document Reviewed: 01/31/2017 Elsevier Interactive Patient Education  2019 [employer].

## 2018-08-20 ENCOUNTER — Ambulatory Visit
Admission: RE | Admit: 2018-08-20 | Discharge: 2018-08-20 | Disposition: A | Discharge status: Home / Self Care | Payer: Managed Care, Other (non HMO) | Source: Ambulatory Visit | Attending: Adult Health | Admitting: Adult Health

## 2018-08-20 ENCOUNTER — Ambulatory Visit
Admission: RE | Admit: 2018-08-20 | Discharge: 2018-08-20 | Disposition: A | Discharge status: Home / Self Care | Payer: Managed Care, Other (non HMO) | Attending: Adult Health | Admitting: Adult Health

## 2018-08-20 DIAGNOSIS — M545 Low back pain, unspecified: Secondary | ICD-10-CM

## 2018-08-20 LAB — COMPREHENSIVE METABOLIC PANEL
ALK PHOS: 87 IU/L (ref 39–117)
ALT: 16 IU/L (ref 0–32)
AST: 22 IU/L (ref 0–40)
Albumin/Globulin Ratio: 2.2 (ref 1.2–2.2)
Albumin: 4.6 g/dL (ref 3.8–4.8)
BUN/Creatinine Ratio: 35 — ABNORMAL HIGH (ref 12–28)
BUN: 21 mg/dL (ref 8–27)
Bilirubin Total: <0.2 mg/dL (ref 0.0–1.2)
CHLORIDE: 99 mmol/L (ref 96–106)
CO2: 25 mmol/L (ref 20–29)
CREATININE: 0.6 mg/dL (ref 0.57–1.00)
Calcium: 9.7 mg/dL (ref 8.7–10.3)
GFR calc Af Amer: 112 mL/min/{1.73_m2} (ref >59)
GFR calc non Af Amer: 97 mL/min/{1.73_m2} (ref >59)
GLUCOSE: 83 mg/dL (ref 65–99)
Globulin, Total: 2.1 g/dL (ref 1.5–4.5)
Potassium: 4.1 mmol/L (ref 3.5–5.2)
SODIUM: 137 mmol/L (ref 134–144)
Total Protein: 6.7 g/dL (ref 6.0–8.5)

## 2018-08-20 LAB — CBC WITH DIFFERENTIAL/PLATELET
BASOS ABS: 0.1 10*3/uL (ref 0.0–0.2)
Basos: 1 %
EOS (ABSOLUTE): 0.2 10*3/uL (ref 0.0–0.4)
Eos: 3 %
Hematocrit: 40.2 % (ref 34.0–46.6)
Hemoglobin: 13.9 g/dL (ref 11.1–15.9)
Immature Grans (Abs): 0 10*3/uL (ref 0.0–0.1)
Immature Granulocytes: 0 %
LYMPHS ABS: 1.9 10*3/uL (ref 0.7–3.1)
Lymphs: 32 %
MCH: 28.7 pg (ref 26.6–33.0)
MCHC: 34.6 g/dL (ref 31.5–35.7)
MCV: 83 fL (ref 79–97)
MONOS ABS: 0.4 10*3/uL (ref 0.1–0.9)
Monocytes: 6 %
Neutrophils Absolute: 3.5 10*3/uL (ref 1.4–7.0)
Neutrophils: 58 %
Platelets: 215 10*3/uL (ref 150–450)
RBC: 4.85 x10E6/uL (ref 3.77–5.28)
RDW: 13.6 % (ref 11.7–15.4)
WBC: 6 10*3/uL (ref 3.4–10.8)

## 2018-08-21 ENCOUNTER — Telehealth: Payer: Self-pay | Admitting: Adult Health

## 2018-08-21 ENCOUNTER — Telehealth: Payer: Self-pay | Admitting: Urology

## 2018-08-21 NOTE — Telephone Encounter (Signed)
Patient called the office today to report that she cancelled her CT scan at [facility].  She would like to have her CT scan at [facility] (Phone #[phone]).

## 2018-08-21 NOTE — Telephone Encounter (Signed)
CLINICAL DATA:  Acute bilateral low back pain without sciatica  EXAM: LUMBAR SPINE - COMPLETE 4+ VIEW  COMPARISON:  CT abdomen pelvis 05/16/2018  FINDINGS: Mild dextroscoliosis. Normal alignment. No fracture or mass. Mild disc degeneration at L1-2. Remaining disc spaces intact.  IMPRESSION: Scoliosis and mild degenerative change.  No acute abnormality.   Electronically Signed   By: Marlan Palau M.D.   On: 08/20/2018 16:45  08/21/2018 at 8:20 AM patient was called with the above x-ray results read to patient:  IMPRESSION: Scoliosis and mild degenerative change.  No acute abnormality. She should  follow-up with her primary care physician and keep her appointment with urology on 08/22/2018.  Patient states that she is feeling a " little better"  since she was seen in the office on 218 and denies any new or worsening symptoms since that visit. Emergency room if any symptoms change or worsen.  Patient verbalized understanding of all instructions given and denies any further questions at this time.

## 2018-08-22 ENCOUNTER — Ambulatory Visit: Payer: Managed Care, Other (non HMO) | Admitting: Urology

## 2018-08-22 ENCOUNTER — Telehealth: Payer: Self-pay | Admitting: Adult Health

## 2018-08-22 ENCOUNTER — Encounter: Payer: Self-pay | Admitting: Adult Health

## 2018-08-22 LAB — URINE CULTURE

## 2018-08-22 MED ORDER — AMOXICILLIN-POT CLAVULANATE 875-125 MG PO TABS: 1.0000 | ORAL_TABLET | Freq: Two times a day (BID) | ORAL | 0 refills | Status: DC

## 2018-08-22 NOTE — Addendum Note (Signed)
Addended by: Berniece Pap on: 08/22/2018 03:21 PM   Modules accepted: Orders

## 2018-08-22 NOTE — Telephone Encounter (Addendum)
08/22/2018 left message at 1:47pm for patient to return call to clinic, urine culture has been pending and resulted at 12:36 PM today as below.  Provider will verify with patient that she has no allergy to Augmentin and will prescribe based on culture and sensitivity.  Patient had a clean point-of-care testing urine on 08/19/2018 and no dysuria or urinary symptoms with bilateral lower back pain.  08/22/18 Patient returned call.  Allergies verified:  Allergies  Allergen Reactions  . Prednisone   . Codeine Nausea Only   Meds ordered this encounter  Medications  . amoxicillin-clavulanate (AUGMENTIN) 875-125 MG tablet    Sig: Take 1 tablet by mouth 2 (two) times daily.    Dispense:  14 tablet    Refill:  0   Patient will return call to provider if she does not have antibiotic at the pharmacy today.  Reports that Dr. Lonna Cobb office canceled her appointment and wants her to have her CT scan follow-up done prior to seeing them and that she will be seeing them within the next week.  She should have a follow-up urine culture 5 to 7 days after completing antibiotics or if any symptoms persist. Patient verbalized understanding of all instructions given and denies any further questions at this time.     Urine Culture  Order: [id number]  Status:  Final result  Visible to patient:  No (Not Released)  Next appt:  None  Dx:  Hydronephrosis, right per CT SCAN 11/...  Specimen Information: Urine, Clean Catch     Component 3d ago  Urine Culture, Routine Final reportAbnormal    Organism ID, Bacteria Klebsiella pneumoniaeAbnormal    Comment: 50,000-100,000 colony forming units per mL  Cefazolin <=4 ug/mL  Cefazolin with an MIC <=16 predicts susceptibility to the oral agents  cefaclor, cefdinir, cefpodoxime, cefprozil, cefuroxime, cephalexin,  and loracarbef when used for therapy of uncomplicated urinary tract  infections due to E. coli, Klebsiella pneumoniae, and Proteus  mirabilis.    Antimicrobial Susceptibility Comment   Comment:   ** S = Susceptible; I = Intermediate; R = Resistant **           P = Positive; N = Negative        MICS are expressed in micrograms per mL   Antibiotic         RSLT#1  RSLT#2  RSLT#3  RSLT#4  Amoxicillin/Clavulanic Acid  S  Ampicillin           R  Cefepime            S  Ceftriaxone          S  Cefuroxime           S  Ciprofloxacin         S  Ertapenem           S  Gentamicin           S  Imipenem            S  Levofloxacin          S  Meropenem           S  Nitrofurantoin         I  Piperacillin/Tazobactam    S  Tetracycline          S  Tobramycin           S  Trimethoprim/Sulfa       S   Resulting Agency [facility]    Narrative  Performed by: Verdell Carmine  Performed at: [address], [place], [place] [id number] Lab Director: Jolene Schimke MD, Phone: [phone]    Specimen Collected: 08/19/18 14:05  Last Resulted: 08/22/18 11:36

## 2018-08-22 NOTE — Telephone Encounter (Signed)
08/22/2018 left message at 1:00 of for patient to return call to clinic, urine culture has been pending and resulted at 12:36 PM today as below.  Provider will verify with patient that she has no allergy to Augmentin and will prescribe based on culture and sensitivity.  Patient had a clean point-of-care testing urine on 08/19/2018 and no dysuria or urinary symptoms with bilateral lower back pain.    Urine Culture  Order: [id number]  Status:  Final result  Visible to patient:  No (Not Released)  Next appt:  None  Dx:  Hydronephrosis, right per CT SCAN 11/...  Specimen Information: Urine, Clean Catch     Component 3d ago  Urine Culture, Routine Final reportAbnormal    Organism ID, Bacteria Klebsiella pneumoniaeAbnormal    Comment: 50,000-100,000 colony forming units per mL  Cefazolin <=4 ug/mL  Cefazolin with an MIC <=16 predicts susceptibility to the oral agents  cefaclor, cefdinir, cefpodoxime, cefprozil, cefuroxime, cephalexin,  and loracarbef when used for therapy of uncomplicated urinary tract  infections due to E. coli, Klebsiella pneumoniae, and Proteus  mirabilis.   Antimicrobial Susceptibility Comment   Comment:   ** S = Susceptible; I = Intermediate; R = Resistant **           P = Positive; N = Negative        MICS are expressed in micrograms per mL   Antibiotic         RSLT#1  RSLT#2  RSLT#3  RSLT#4  Amoxicillin/Clavulanic Acid  S  Ampicillin           R  Cefepime            S  Ceftriaxone          S  Cefuroxime           S  Ciprofloxacin         S  Ertapenem           S  Gentamicin           S  Imipenem            S  Levofloxacin          S  Meropenem           S  Nitrofurantoin         I  Piperacillin/Tazobactam    S  Tetracycline          S  Tobramycin           S  Trimethoprim/Sulfa        S   Resulting Agency [facility]    Narrative  Performed by: [facility]  Performed at: [address] [place], [place] [id number] Lab Director: Jolene Schimke MD, Phone: [phone]    Specimen Collected: 08/19/18 14:05  Last Resulted: 08/22/18 11:36

## 2018-08-22 NOTE — Progress Notes (Signed)
08/22/18 12:45pm  Your urine culture just returned with bacteria present despite clean POCT on urinalysis machine on 08/19/18  - I know you have an appointment with Urology today.  You will need an antibiotic and I am happy to send that in. Please call the office at [phone] to verify your allergies and pharmacy. Please keep appointment to follow up with doctor Stoiff.

## 2018-08-22 NOTE — Progress Notes (Signed)
Patient was reached.

## 2018-08-22 NOTE — Telephone Encounter (Signed)
Order faxed to [facility] health today PA changed to reflect this   Darlene Yang

## 2018-08-23 ENCOUNTER — Ambulatory Visit: Payer: Managed Care, Other (non HMO) | Admitting: Urology

## 2018-08-27 ENCOUNTER — Telehealth: Payer: Self-pay | Admitting: Urology

## 2018-08-27 DIAGNOSIS — N1339 Other hydronephrosis: Secondary | ICD-10-CM

## 2018-08-27 DIAGNOSIS — R319 Hematuria, unspecified: Secondary | ICD-10-CM

## 2018-08-27 NOTE — Telephone Encounter (Signed)
Order has been entered

## 2018-08-27 NOTE — Telephone Encounter (Signed)
Patient has now called and requested her CT be done at [facility] not [facility]

## 2018-08-27 NOTE — Telephone Encounter (Signed)
This patient has now decided to have her CT scan done at [facility] imaging not [facility] but in order to do this I will need a new order for [place] imaging as the facility, the one in Epic says [facility] and we can't use that one.   Thanks, Marcelino Duster

## 2018-09-01 ENCOUNTER — Emergency Department: Payer: Managed Care, Other (non HMO)

## 2018-09-01 ENCOUNTER — Other Ambulatory Visit: Payer: Self-pay

## 2018-09-01 ENCOUNTER — Observation Stay
Admission: EM | Admit: 2018-09-01 | Discharge: 2018-09-02 | Disposition: A | Discharge status: Home / Self Care | Payer: Managed Care, Other (non HMO) | Attending: Internal Medicine | Admitting: Internal Medicine

## 2018-09-01 ENCOUNTER — Observation Stay: Payer: Managed Care, Other (non HMO)

## 2018-09-01 DIAGNOSIS — Z7982 Long term (current) use of aspirin: Secondary | ICD-10-CM | POA: Diagnosis not present

## 2018-09-01 DIAGNOSIS — I639 Cerebral infarction, unspecified: Secondary | ICD-10-CM | POA: Diagnosis present

## 2018-09-01 DIAGNOSIS — Z79899 Other long term (current) drug therapy: Secondary | ICD-10-CM | POA: Insufficient documentation

## 2018-09-01 DIAGNOSIS — Z8249 Family history of ischemic heart disease and other diseases of the circulatory system: Secondary | ICD-10-CM | POA: Insufficient documentation

## 2018-09-01 DIAGNOSIS — G454 Transient global amnesia: Secondary | ICD-10-CM | POA: Diagnosis not present

## 2018-09-01 DIAGNOSIS — E785 Hyperlipidemia, unspecified: Secondary | ICD-10-CM | POA: Insufficient documentation

## 2018-09-01 HISTORY — DX: Hyperlipidemia, unspecified: E78.5

## 2018-09-01 HISTORY — DX: Bell's palsy: G51.0

## 2018-09-01 HISTORY — DX: Cerebral infarction, unspecified: I63.9

## 2018-09-01 HISTORY — DX: Urinary tract infection, site not specified: N39.0

## 2018-09-01 LAB — DIFFERENTIAL
Abs Immature Granulocytes: 0.03 10*3/uL (ref 0.00–0.07)
Basophils Absolute: 0.1 10*3/uL (ref 0.0–0.1)
Basophils Relative: 1 %
EOS PCT: 1 %
Eosinophils Absolute: 0.1 10*3/uL (ref 0.0–0.5)
IMMATURE GRANULOCYTES: 0 %
Lymphocytes Relative: 12 %
Lymphs Abs: 1.3 10*3/uL (ref 0.7–4.0)
Monocytes Absolute: 0.6 10*3/uL (ref 0.1–1.0)
Monocytes Relative: 5 %
Neutro Abs: 9.2 10*3/uL — ABNORMAL HIGH (ref 1.7–7.7)
Neutrophils Relative %: 81 %

## 2018-09-01 LAB — URINALYSIS, COMPLETE (UACMP) WITH MICROSCOPIC
Bacteria, UA: NONE SEEN
Bilirubin Urine: NEGATIVE
Glucose, UA: NEGATIVE mg/dL
Ketones, ur: NEGATIVE mg/dL
Leukocytes,Ua: NEGATIVE
Nitrite: NEGATIVE
PH: 5 (ref 5.0–8.0)
Protein, ur: NEGATIVE mg/dL
Specific Gravity, Urine: 1.015 (ref 1.005–1.030)
Squamous Epithelial / HPF: NONE SEEN (ref 0–5)

## 2018-09-01 LAB — PROTIME-INR
INR: 0.9 (ref 0.8–1.2)
Prothrombin Time: 12 seconds (ref 11.4–15.2)

## 2018-09-01 LAB — COMPREHENSIVE METABOLIC PANEL
ALT: 18 U/L (ref 0–44)
ANION GAP: 8 (ref 5–15)
AST: 19 U/L (ref 15–41)
Albumin: 4.7 g/dL (ref 3.5–5.0)
Alkaline Phosphatase: 66 U/L (ref 38–126)
BUN: 21 mg/dL (ref 8–23)
CO2: 28 mmol/L (ref 22–32)
Calcium: 9.7 mg/dL (ref 8.9–10.3)
Chloride: 102 mmol/L (ref 98–111)
Creatinine, Ser: 0.66 mg/dL (ref 0.44–1.00)
GFR calc Af Amer: >60 mL/min (ref >60)
GFR calc non Af Amer: >60 mL/min (ref >60)
Glucose, Bld: 101 mg/dL — ABNORMAL HIGH (ref 70–99)
Potassium: 4.2 mmol/L (ref 3.5–5.1)
Sodium: 138 mmol/L (ref 135–145)
Total Bilirubin: 0.8 mg/dL (ref 0.3–1.2)
Total Protein: 8.1 g/dL (ref 6.5–8.1)

## 2018-09-01 LAB — CBC
HEMATOCRIT: 48.6 % — AB (ref 36.0–46.0)
Hemoglobin: 15.6 g/dL — ABNORMAL HIGH (ref 12.0–15.0)
MCH: 27.6 pg (ref 26.0–34.0)
MCHC: 32.1 g/dL (ref 30.0–36.0)
MCV: 85.9 fL (ref 80.0–100.0)
Platelets: 213 10*3/uL (ref 150–400)
RBC: 5.66 MIL/uL — ABNORMAL HIGH (ref 3.87–5.11)
RDW: 13.3 % (ref 11.5–15.5)
WBC: 11.3 10*3/uL — ABNORMAL HIGH (ref 4.0–10.5)
nRBC: 0 % (ref 0.0–0.2)

## 2018-09-01 LAB — APTT: aPTT: 33 seconds (ref 24–36)

## 2018-09-01 LAB — TROPONIN I: Troponin I: <0.03 ng/mL (ref <0.03)

## 2018-09-01 MED ORDER — ACETAMINOPHEN 325 MG PO TABS
650.0000 mg | ORAL_TABLET | Freq: Four times a day (QID) | ORAL | Status: DC | PRN
  Administered 2018-09-02: 650 mg via ORAL
  Filled 2018-09-01 (×3): qty 2

## 2018-09-01 MED ORDER — KETOROLAC TROMETHAMINE 30 MG/ML IJ SOLN
15.0000 mg | Freq: Once | INTRAMUSCULAR | Status: AC
  Administered 2018-09-01: 15 mg via INTRAVENOUS
  Filled 2018-09-01: qty 1

## 2018-09-01 MED ORDER — ONDANSETRON HCL 4 MG/2ML IJ SOLN: 4.0000 mg | Freq: Four times a day (QID) | INTRAMUSCULAR | Status: DC | PRN

## 2018-09-01 MED ORDER — METOCLOPRAMIDE HCL 5 MG/ML IJ SOLN
10.0000 mg | Freq: Once | INTRAMUSCULAR | Status: AC
  Administered 2018-09-01: 10 mg via INTRAVENOUS
  Filled 2018-09-01: qty 2

## 2018-09-01 MED ORDER — GADOBUTROL 1 MMOL/ML IV SOLN
5.0000 mL | Freq: Once | INTRAVENOUS | Status: AC | PRN
  Administered 2018-09-01: 5 mL via INTRAVENOUS

## 2018-09-01 MED ORDER — ASPIRIN 81 MG PO CHEW
324.0000 mg | CHEWABLE_TABLET | Freq: Once | ORAL | Status: AC
  Administered 2018-09-01: 324 mg via ORAL
  Filled 2018-09-01: qty 4

## 2018-09-01 MED ORDER — SODIUM CHLORIDE 0.9 % IV SOLN
INTRAVENOUS | Status: DC
  Administered 2018-09-01: 22:00:00 via INTRAVENOUS

## 2018-09-01 MED ORDER — ONDANSETRON HCL 4 MG PO TABS
4.0000 mg | ORAL_TABLET | Freq: Four times a day (QID) | ORAL | Status: DC | PRN
  Filled 2018-09-01: qty 1

## 2018-09-01 MED ORDER — SODIUM CHLORIDE 0.9 % IV SOLN
Freq: Once | INTRAVENOUS | Status: AC
  Administered 2018-09-01: 18:00:00 via INTRAVENOUS

## 2018-09-01 MED ORDER — ENOXAPARIN SODIUM 40 MG/0.4ML ~~LOC~~ SOLN
40.0000 mg | SUBCUTANEOUS | Status: DC
  Administered 2018-09-02: 40 mg via SUBCUTANEOUS
  Filled 2018-09-01 (×2): qty 0.4

## 2018-09-01 MED ORDER — SODIUM CHLORIDE 0.9% FLUSH: 3.0000 mL | Freq: Once | INTRAVENOUS | Status: DC

## 2018-09-01 MED ORDER — MAGNESIUM SULFATE 2 GM/50ML IV SOLN
2.0000 g | Freq: Once | INTRAVENOUS | Status: AC
  Administered 2018-09-01: 2 g via INTRAVENOUS
  Filled 2018-09-01: qty 50

## 2018-09-01 MED ORDER — ACETAMINOPHEN 650 MG RE SUPP
650.0000 mg | Freq: Four times a day (QID) | RECTAL | Status: DC | PRN
  Filled 2018-09-01: qty 1

## 2018-09-01 NOTE — ED Notes (Signed)
Pt off the floor in CT scan at this time 

## 2018-09-01 NOTE — ED Notes (Signed)
First RN Note: Pt presents to ED via wheelchair from [facility] with c/o confusion, per [facility] RN pt cannot remember what she did last night, pt can only remember day of the week, cannot remember long term facts such as her mother's passing.

## 2018-09-01 NOTE — ED Provider Notes (Addendum)
[facility] Emergency Department Provider Note       Time seen: ----------------------------------------- 4:16 PM on 09/01/2018 -----------------------------------------   I have reviewed the triage vital signs and the nursing notes.  HISTORY   Chief Complaint Transient Ischemic Attack    HPI Darlene Yang is a 63 y.o. female with a history of Bell's palsy, hyperlipidemia, UTI who presents to the ED for altered mental status and confusion.  Patient is here with her daughters from [facility] acute care.  When she woke up this morning she was having trouble remembering things.  She does not member getting dressed or eating breakfast this morning.  She has been disoriented only upon awakening.  Last night she was acting and behaving normally.  She has a chronic right facial droop from Bell's palsy.  She does not have any specific neurologic complaints other than memory disturbance.  Family reports she just finished antibiotics for UTI.  Past Medical History:  Diagnosis Date  . Abnormal Pap smear of cervix    s/p LEEP  . Bell's palsy    with right face affect  . Cervical dysplasia 1999  . Hyperlipemia   . UTI (urinary tract infection)     Patient Active Problem List   Diagnosis Date Noted  . Family history of malignant melanoma 01/27/2018  . Family history of thyroid disease 01/27/2018  . Hyperglycemia 01/27/2018  . Hyperlipidemia, mixed 01/27/2018  . Vitamin D insufficiency 01/27/2018  . Cervical dysplasia 07/03/1997    Past Surgical History:  Procedure Laterality Date  . BREAST BIOPSY Left 1999   benign needle bx per pt  . LEEP  1999  . TOTAL VAGINAL HYSTERECTOMY  1999   TVHBSO due to cysts/CPP at [facility]    Allergies Prednisone and Codeine  Social History Social History   Tobacco Use  . Smoking status: Never Smoker  . Smokeless tobacco: Never Used  Substance Use Topics  . Alcohol use: No    Alcohol/week: 0.0 standard drinks  .  Drug use: No   Review of Systems Constitutional: Negative for fever. Cardiovascular: Negative for chest pain. Respiratory: Negative for shortness of breath. Gastrointestinal: Negative for abdominal pain, vomiting and diarrhea. Musculoskeletal: Negative for back pain. Skin: Negative for rash. Neurological: Positive for disorientation and memory disturbance  All systems negative/normal/unremarkable except as stated in the HPI  ____________________________________________   PHYSICAL EXAM:  VITAL SIGNS: ED Triage Vitals [09/01/18 1353]  Enc Vitals Group     BP (!) 145/90     Pulse Rate 99     Resp 18     Temp 97.8 F (36.6 C)     Temp Source Oral     SpO2 96 %     Weight 130 lb (59 kg)     Height 5\' 2"  (1.575 m)     Head Circumference      Peak Flow      Pain Score 8     Pain Loc      Pain Edu?      Excl. in GC?     Constitutional: Alert and oriented. Well appearing and in no distress. Eyes: Conjunctivae are normal. Normal extraocular movements. ENT      Head: Normocephalic and atraumatic.      Nose: No congestion/rhinnorhea.      Mouth/Throat: Mucous membranes are moist.      Neck: No stridor. Cardiovascular: Normal rate, regular rhythm. No murmurs, rubs, or gallops. Respiratory: Normal respiratory effort without tachypnea nor retractions. Breath  sounds are clear and equal bilaterally. No wheezes/rales/rhonchi. Gastrointestinal: Soft and nontender. Normal bowel sounds Musculoskeletal: Nontender with normal range of motion in extremities. No lower extremity tenderness nor edema. Neurologic:  Normal speech and language. No gross focal neurologic deficits are appreciated.  Skin:  Skin is warm, dry and intact. No rash noted. Psychiatric: Mood and affect are normal.  ____________________________________________  EKG: Interpreted by me.  Sinus rhythm the rate of 83 bpm, normal PR interval, right ward axis, normal QT  ____________________________________________  ED  COURSE:  As part of my medical decision making, I reviewed the following data within the electronic MEDICAL RECORD NUMBER History obtained from family if available, nursing notes, old chart and ekg, as well as notes from prior ED visits. Patient presented for altered mental status and confusion, we will assess with labs and imaging as indicated at this time.   Procedures ____________________________________________   LABS (pertinent positives/negatives)  Labs Reviewed  CBC - Abnormal; Notable for the following components:      Result Value   WBC 11.3 (*)    RBC 5.66 (*)    Hemoglobin 15.6 (*)    HCT 48.6 (*)    All other components within normal limits  DIFFERENTIAL - Abnormal; Notable for the following components:   Neutro Abs 9.2 (*)    All other components within normal limits  COMPREHENSIVE METABOLIC PANEL - Abnormal; Notable for the following components:   Glucose, Bld 101 (*)    All other components within normal limits  URINALYSIS, COMPLETE (UACMP) WITH MICROSCOPIC - Abnormal; Notable for the following components:   Color, Urine YELLOW (*)    APPearance CLEAR (*)    Hgb urine dipstick SMALL (*)    All other components within normal limits  URINE CULTURE  PROTIME-INR  APTT  TROPONIN I  I-STAT CREATININE, ED  CBG MONITORING, ED    RADIOLOGY  CT head is unremarkable  ____________________________________________   DIFFERENTIAL DIAGNOSIS   Dehydration, electrolyte abnormality, TIA, CVA, sepsis, UTI  FINAL ASSESSMENT AND PLAN  Transient global amnesia  Plan: The patient had presented for confusion and memory loss. Patient's labs were unremarkable. Patient's imaging is also unremarkable.  Patient likely has transient global amnesia.  Have discussed with neurology who recommends admission, MRI and observation.  She was given aspirin.   Ulice Dash, MD    Note: This note was generated in part or whole with voice recognition software. Voice recognition is  usually quite accurate but there are transcription errors that can and very often do occur. I apologize for any typographical errors that were not detected and corrected.     Emily Filbert, MD 09/01/18 1753    Emily Filbert, MD 09/01/18 [phone]

## 2018-09-01 NOTE — ED Notes (Signed)
Patient transported to MRI.AS 

## 2018-09-01 NOTE — H&P (Signed)
[facility] [place] [facility]   PATIENT NAME: Darlene Yang    MR#:  [id number]  DATE OF BIRTH:  06-May-1956  DATE OF ADMISSION:  09/01/2018  PRIMARY CARE PHYSICIAN: [name], MD   REQUESTING/REFERRING PHYSICIAN: Dr Presley Raddle  CHIEF COMPLAINT:   Unable to remember anything since last night  HISTORY OF PRESENT ILLNESS:  Darlene Yang  is a 63 y.o. female presents to the hospital after not remembering anything since last night.  She does not remember going to the concert.  She was dropped off from the concert by her daughter and she got in the house and apparently went into bed.  Her husband was sleeping at the time since it was late.  Her husband did wake up in the middle night and she was sleeping.  When she woke up she was not acting right and asking a lot of questions over and over again.  Family brought her to the urgent care center and they referred her to the hospital.  She does not recall how she got to the hospital.  She complains of a headache but otherwise feels okay.  Of note, the daughter states that the patient's mother passed away a little over 2 months ago.  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  Hospitalist services were contacted for further evaluation.  ER physician spoke with neurology.  PAST MEDICAL HISTORY:   Past Medical History:  Diagnosis Date  . Abnormal Pap smear of cervix    s/p LEEP  . Bell's palsy    with right face affect  . Cervical dysplasia 1999  . Hyperlipemia   . UTI (urinary tract infection)     PAST SURGICAL HISTORY:   Past Surgical History:  Procedure Laterality Date  . BREAST BIOPSY Left 1999   benign needle bx per pt  . LEEP  1999  . torn retina    . TOTAL VAGINAL HYSTERECTOMY  1999   TVHBSO due to cysts/CPP at [facility]  . TUBAL LIGATION      SOCIAL HISTORY:   Social History   Tobacco Use  . Smoking status: Never Smoker  . Smokeless  tobacco: Never Used  Substance Use Topics  . Alcohol use: No    Alcohol/week: 0.0 standard drinks    FAMILY HISTORY:   Family History  Problem Relation Age of Onset  . Hypertension Mother   . Hyperlipidemia Mother   . CAD Father   . Hypertension Father   . Hyperlipidemia Father   . Breast cancer Neg Hx     DRUG ALLERGIES:   Allergies  Allergen Reactions  . Prednisone   . Codeine Nausea Only    REVIEW OF SYSTEMS:  CONSTITUTIONAL: No fever, fatigue or weakness.  Positive for headache. EYES: No blurred or double vision.  EARS, NOSE, AND THROAT: No tinnitus or ear pain. No sore throat RESPIRATORY: No cough, shortness of breath, wheezing or hemoptysis.  CARDIOVASCULAR: No chest pain, orthopnea, edema.  GASTROINTESTINAL: No nausea, vomiting, diarrhea or abdominal pain. No blood in bowel movements GENITOURINARY: No dysuria, hematuria.  ENDOCRINE: No polyuria, nocturia,  HEMATOLOGY: No anemia, easy bruising or bleeding SKIN: No rash or lesion. MUSCULOSKELETAL: No joint pain or arthritis.   NEUROLOGIC: No tingling, numbness, weakness.  PSYCHIATRY: No anxiety or depression.   MEDICATIONS AT HOME:   Prior to Admission medications   Medication Sig Start Date End Date Taking? Authorizing Provider  cholecalciferol (VITAMIN D)  1000 units tablet Take 1,000 Units by mouth daily.   Yes [provider]  loratadine (CLARITIN) 10 MG tablet Take 10 mg by mouth daily.   Yes [provider]  Multiple Vitamin (MULTIVITAMIN) tablet Take 1 tablet by mouth daily.   Yes [provider]      VITAL SIGNS:  Blood pressure 120/73, pulse 84, temperature 97.8 F (36.6 C), temperature source Oral, resp. rate 16, height 5\' 2"  (1.575 m), weight 59 kg, SpO2 99 %.  PHYSICAL EXAMINATION:  GENERAL:  63 y.o.-year-old patient lying in the bed with no acute distress.  EYES: Pupils equal, round, reactive to light and accommodation. No scleral icterus. Extraocular muscles intact.   HEENT: Head atraumatic, normocephalic. Oropharynx and nasopharynx clear.  NECK:  Supple, no jugular venous distention. No thyroid enlargement, no tenderness.  LUNGS: Normal breath sounds bilaterally, no wheezing, rales,rhonchi or crepitation. No use of accessory muscles of respiration.  CARDIOVASCULAR: S1, S2 normal. No murmurs, rubs, or gallops.  ABDOMEN: Soft, nontender, nondistended. Bowel sounds present. No organomegaly or mass.  EXTREMITIES: No pedal edema, cyanosis, or clubbing.  NEUROLOGIC: Cranial nerves II through XII are intact. Muscle strength 5/5 in all extremities. Sensation intact. Gait not checked.  Slight twitching right face which family states since she had her Bell's palsy that this is normal for her. PSYCHIATRIC: The patient is alert and oriented x 3.  SKIN: No rash, lesion, or ulcer.   LABORATORY PANEL:   CBC Recent Labs  Lab 09/01/18 1406  WBC 11.3*  HGB 15.6*  HCT 48.6*  PLT 213   ------------------------------------------------------------------------------------------------------------------  Chemistries  Recent Labs  Lab 09/01/18 1406  NA 138  K 4.2  CL 102  CO2 28  GLUCOSE 101*  BUN 21  CREATININE 0.66  CALCIUM 9.7  AST 19  ALT 18  ALKPHOS 66  BILITOT 0.8   ------------------------------------------------------------------------------------------------------------------  Cardiac Enzymes Recent Labs  Lab 09/01/18 1406  TROPONINI <0.03   ------------------------------------------------------------------------------------------------------------------  RADIOLOGY:  Ct Head Wo Contrast  Result Date: 09/01/2018 CLINICAL DATA:  Memory loss EXAM: CT HEAD WITHOUT CONTRAST TECHNIQUE: Contiguous axial images were obtained from the base of the skull through the vertex without intravenous contrast. COMPARISON:  MRI from 06/17/2008 FINDINGS: Brain: No evidence of acute infarction, hemorrhage, hydrocephalus, extra-axial collection or mass lesion/mass  effect. Vascular: No hyperdense vessel or unexpected calcification. Skull: Normal. Negative for fracture or focal lesion. Sinuses/Orbits: No acute finding. Other: None. IMPRESSION: No acute intracranial abnormality noted. Electronically Signed   By: Alcide CleverMark  Lukens M.D.   On: 09/01/2018 14:41    EKG:   Normal sinus rhythm, left atrial enlargement, right axis deviation  IMPRESSION AND PLAN:   1.  Transient global amnesia.  ER physician spoke with neurology.  Will get neurology consult tomorrow.  MRI of the brain, echocardiogram and carotid ultrasound.  Aspirin given.  Will check lipid profile tomorrow.  In speaking with the daughter the patient did have a traumatic event with losing her mother a little over 2 months ago which could be the triggering factor here. 2.  Headache will give IV magnesium and let her eat.  All the records are reviewed and case discussed with ED provider. Management plans discussed with the patient, family and they are in agreement.  CODE STATUS: Full code  TOTAL TIME TAKING CARE OF THIS PATIENT: 50 minutes.    Alford Highlandichard Anastasha Ortez M.D on 09/01/2018 at 6:32 PM  Between 7am to 6pm - Pager - [phone]  After 6pm call admission pager [phone]  [facility]  [phone]  CC: Primary care physician; [name], MD

## 2018-09-01 NOTE — ED Notes (Signed)
Pt reporting ongoing HA, states that Tylenol does not typically relieve her HA's. Admitting team notified, awaiting orders.

## 2018-09-01 NOTE — ED Notes (Signed)
Asmitting team at bedside.

## 2018-09-01 NOTE — ED Triage Notes (Signed)
Pt is here with her daughters from [facility], states when woke up this morning, doesn't remember going home from a concert last night, doesn't remember getting dressed or eating breakfast this morning , states she feels disoriented, speech is clear, pt c/o frontal HA. Pt has drooping to the right side but daughters states that is normal from bells palsy in the past. Daughter states she was fine when they took her home last night. States the husband noticed the pt was not her self and having issues remembering things. Pt states she remembered some of the concert last night but doesn't remember going home last night

## 2018-09-01 NOTE — ED Notes (Signed)
Pt is repetitive in triage, asking her daughter the same questions over and over.Marland Kitchen

## 2018-09-01 NOTE — ED Notes (Signed)
Report received from Kayenta, RN, Pt care assumed at this time. Introduced to pt and family.

## 2018-09-02 ENCOUNTER — Observation Stay: Payer: Managed Care, Other (non HMO)

## 2018-09-02 ENCOUNTER — Observation Stay (HOSPITAL_BASED_OUTPATIENT_CLINIC_OR_DEPARTMENT_OTHER)
Admit: 2018-09-02 | Discharge: 2018-09-02 | Disposition: A | Discharge status: Home / Self Care | Payer: Managed Care, Other (non HMO) | Attending: Internal Medicine | Admitting: Internal Medicine

## 2018-09-02 ENCOUNTER — Other Ambulatory Visit: Payer: Self-pay

## 2018-09-02 DIAGNOSIS — I639 Cerebral infarction, unspecified: Secondary | ICD-10-CM

## 2018-09-02 DIAGNOSIS — I6350 Cerebral infarction due to unspecified occlusion or stenosis of unspecified cerebral artery: Secondary | ICD-10-CM | POA: Diagnosis not present

## 2018-09-02 LAB — URINE DRUG SCREEN, QUALITATIVE (ARMC ONLY)
Amphetamines, Ur Screen: NOT DETECTED
Barbiturates, Ur Screen: NOT DETECTED
Benzodiazepine, Ur Scrn: NOT DETECTED
Cannabinoid 50 Ng, Ur ~~LOC~~: NOT DETECTED
Cocaine Metabolite,Ur ~~LOC~~: NOT DETECTED
MDMA (Ecstasy)Ur Screen: NOT DETECTED
Methadone Scn, Ur: NOT DETECTED
Opiate, Ur Screen: NOT DETECTED
Phencyclidine (PCP) Ur S: NOT DETECTED
TRICYCLIC, UR SCREEN: NOT DETECTED

## 2018-09-02 LAB — CBC
HCT: 38.8 % (ref 36.0–46.0)
Hemoglobin: 12.5 g/dL (ref 12.0–15.0)
MCH: 27.4 pg (ref 26.0–34.0)
MCHC: 32.2 g/dL (ref 30.0–36.0)
MCV: 85.1 fL (ref 80.0–100.0)
Platelets: 182 10*3/uL (ref 150–400)
RBC: 4.56 MIL/uL (ref 3.87–5.11)
RDW: 13.3 % (ref 11.5–15.5)
WBC: 5.9 10*3/uL (ref 4.0–10.5)
nRBC: 0 % (ref 0.0–0.2)

## 2018-09-02 LAB — ECHOCARDIOGRAM COMPLETE
HEIGHTINCHES: 62 in
Weight: 2080 oz

## 2018-09-02 LAB — TSH: TSH: 3.943 u[IU]/mL (ref 0.350–4.500)

## 2018-09-02 LAB — BASIC METABOLIC PANEL
Anion gap: 5 (ref 5–15)
BUN: 16 mg/dL (ref 8–23)
CO2: 27 mmol/L (ref 22–32)
Calcium: 8 mg/dL — ABNORMAL LOW (ref 8.9–10.3)
Chloride: 108 mmol/L (ref 98–111)
Creatinine, Ser: 0.5 mg/dL (ref 0.44–1.00)
GFR calc Af Amer: >60 mL/min (ref >60)
GFR calc non Af Amer: >60 mL/min (ref >60)
Glucose, Bld: 96 mg/dL (ref 70–99)
Potassium: 3.7 mmol/L (ref 3.5–5.1)
SODIUM: 140 mmol/L (ref 135–145)

## 2018-09-02 LAB — HEMOGLOBIN A1C
Hgb A1c MFr Bld: 5.8 % — ABNORMAL HIGH (ref 4.8–5.6)
MEAN PLASMA GLUCOSE: 119.76 mg/dL

## 2018-09-02 MED ORDER — IOHEXOL 350 MG/ML SOLN
75.0000 mL | Freq: Once | INTRAVENOUS | Status: AC | PRN
  Administered 2018-09-02: 75 mL via INTRAVENOUS
  Filled 2018-09-02: qty 75

## 2018-09-02 MED ORDER — ASPIRIN EC 81 MG PO TBEC: 81.0000 mg | DELAYED_RELEASE_TABLET | Freq: Every day | ORAL | 0 refills | Status: AC

## 2018-09-02 MED ORDER — ATORVASTATIN CALCIUM 40 MG PO TABS: 40.0000 mg | ORAL_TABLET | Freq: Every day | ORAL | 0 refills | Status: AC

## 2018-09-02 NOTE — Discharge Instructions (Signed)
It was so nice to meet you during this hospitalization!  You came into the hospital with memory loss. Your MRI showed that you had a stroke.   I have prescribed the following medications: 1. Aspirin 81mg  daily 2. Lipitor 40mg  daily (cholesterol medicine)  Please make sure you go to the cardiology office to have a LINQ device placed. This is a long term heart monitor so that we can make sure a funny heart rhythm didn't cause your stroke.  Take care, Dr. Nancy Marus

## 2018-09-02 NOTE — ED Notes (Signed)
Dc instructions given to pt and daughters.  Verbalizes understanding of meds and follow up appts.  Vss.  Dc home ambulatory with daughters.

## 2018-09-02 NOTE — Consult Note (Addendum)
Referring Physician: Campbell Stall  Chief Complaint: Acute onset amnesia  HPI: Darlene Yang is an 63 y.o. female with past medical history of hyperlipidemia, cervical dysplasia and Bell's palsy presenting to the ED on 09/01/2018 with chief complaints of acute onset amnesia.  Patient's daughter who provides collateral history, patient work-up that morning with complaints of amnestic event last night/this morning.  She apparently attended a concert on Saturday and when she woke up Sunday morning she did not remember the events.  Patient's daughter reports she could not remember how she got home or how she got in the bed or eating breakfast that morning.  Patient lost her mother in December, 2019 however did not recall losing her mother and was asking about her.  Patient's daughter reported that this was out of character and very abnormal for her not to remember things.  Patient at baseline exercises daily and keep a meticulous record without any difficulty.  She however could recall family members names and their date of birth without any difficulty.  Denies associated altered sensorium, cranial nerve deficit, seizures like activity, focal motor or sensory deficits, diplopia, nausea or vomiting, headache, dizziness, syncope or LOC, paresthesia (numbness, tingling, pins-and-needles sensation) or a heavy feeling in an extremity. On arrival to the ED, he was afebrile with blood pressure 145/90 mm Hg and pulse rate 99 beats/min. There were no new focal neurological deficits; she has hx of bell's palsy and was noted with right facial droop at baseline. She was alert and oriented x4, but demonstrated memory deficits.  She was noted to be repetitive and tried, asking her daughter the same questions over and over.  Complete blood count (CBC), urinalysis, comprehensive metabolic panel (CMP) were unremarkable. ECG showed sinus rhythm of 83 beats per minute. A non-contrast head CT showed no acute intracranial  abnormality. 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 During hospitalization patient has remained free from recurrent events and maintains a secondary prevention medication regimen including  of aspirin.  Date last known well: Date: 08/31/2018 Time last known well: Unable to determine tPA Given: No: Outside window period  Past Medical History:  Diagnosis Date  . Abnormal Pap smear of cervix    s/p LEEP  . Bell's palsy    with right face affect  . Cervical dysplasia 1999  . Hyperlipemia   . UTI (urinary tract infection)     Past Surgical History:  Procedure Laterality Date  . BREAST BIOPSY Left 1999   benign needle bx per pt  . LEEP  1999  . torn retina    . TOTAL VAGINAL HYSTERECTOMY  1999   TVHBSO due to cysts/CPP at [facility]  . TUBAL LIGATION      Family History  Problem Relation Age of Onset  . Hypertension Mother   . Hyperlipidemia Mother   . CAD Father   . Hypertension Father   . Hyperlipidemia Father   . Stroke Father   . Breast cancer Neg Hx    Social History:  reports that she has never smoked. She has never used smokeless tobacco. She reports that she does not drink alcohol or use drugs.  Allergies:  Allergies  Allergen Reactions  . Prednisone   . Codeine Nausea Only    Medications:  I have reviewed the patient's current medications. Prior to Admission: (Not in a hospital admission)  Prior to Admission medications   Medication Sig Start Date End Date Taking?  Authorizing Provider  cholecalciferol (VITAMIN D) 1000 units tablet Take 1,000 Units by mouth daily.   Yes [provider]  loratadine (CLARITIN) 10 MG tablet Take 10 mg by mouth daily.   Yes [provider]  Multiple Vitamin (MULTIVITAMIN) tablet Take 1 tablet by mouth daily.   Yes [provider]    Scheduled: . enoxaparin (LOVENOX) injection  40 mg Subcutaneous Q24H  . lidocaine  1  application Urethral Once  . sodium chloride flush  3 mL Intravenous Once    ROS: History obtained from the patient   General ROS: negative for - chills, fatigue, fever, night sweats, weight gain or weight loss Psychological ROS: negative for - behavioral disorder, hallucinations, memory difficulties, mood swings or suicidal ideation Ophthalmic ROS: negative for - blurry vision, double vision, eye pain or loss of vision ENT ROS: negative for - epistaxis, nasal discharge, oral lesions, sore throat, tinnitus or vertigo Allergy and Immunology ROS: negative for - hives or itchy/watery eyes Hematological and Lymphatic ROS: negative for - bleeding problems, bruising or swollen lymph nodes Endocrine ROS: negative for - galactorrhea, hair pattern changes, polydipsia/polyuria or temperature intolerance Respiratory ROS: negative for - cough, hemoptysis, shortness of breath or wheezing Cardiovascular ROS: negative for - chest pain, dyspnea on exertion, edema or irregular heartbeat Gastrointestinal ROS: negative for - abdominal pain, diarrhea, hematemesis, nausea/vomiting or stool incontinence Genito-Urinary ROS: negative for - dysuria, hematuria, incontinence or urinary frequency/urgency Musculoskeletal ROS: negative for - joint swelling or muscular weakness Neurological ROS: as noted in HPI Dermatological ROS: negative for rash and skin lesion changes  Physical Examination: Blood pressure 123/76, pulse 79, temperature 98 F (36.7 C), temperature source Oral, resp. rate 18, height 5\' 2"  (1.575 m), weight 59 kg, SpO2 98 %.   HEENT-  Normocephalic, no lesions, without obvious abnormality.  Normal external eye and conjunctiva.  Normal TM's bilaterally.  Normal auditory canals and external ears. Normal external nose, mucus membranes and septum.  Normal pharynx. Cardiovascular- S1, S2 normal, pulses palpable throughout   Lungs- chest clear, no wheezing, rales, normal symmetric air entry Abdomen- soft,  non-tender; bowel sounds normal; no masses,  no organomegaly Extremities- no edema Lymph-no adenopathy palpable Musculoskeletal-no joint tenderness, deformity or swelling Skin-warm and dry, no hyperpigmentation, vitiligo, or suspicious lesions  Neurological Exam   Mental Status: Alert, oriented, thought content appropriate.  Speech fluent without evidence of aphasia.  Able to follow 3 step commands without difficulty. Word recall 3/3. Patient was asked Attention span and concentration seemed appropriate  Cranial Nerves: II: Discs flat bilaterally; Visual fields grossly normal, pupils equal, round, reactive to light and accommodation III,IV, VI: ptosis not present, extra-ocular motions intact bilaterally V,VII: smile asymmetric, right facial droop (hx of bell's palsy) facial light touch sensation intact VIII: hearing normal bilaterally IX,X: gag reflex present XI: bilateral shoulder shrug XII: midline tongue extension Motor: Right :  Upper extremity   5/5 Without pronator drift      Left: Upper extremity   5/5 without pronator drift Right:   Lower extremity   5/5                                          Left: Lower extremity   5/5 Tone and bulk:normal tone throughout; no atrophy noted Sensory: Pinprick and light touch intact bilaterally Deep Tendon Reflexes: 2+ and symmetric throughout Plantars: Right: mute  Left: mute Cerebellar: Finger-to-nose testing intact bilaterally. Heel to shin testing normal bilaterally Gait: not tested due to safety concerns  Data Reviewed  Laboratory Studies:  Basic Metabolic Panel: Recent Labs  Lab 09/01/18 1406 09/02/18 0415  NA 138 140  K 4.2 3.7  CL 102 108  CO2 28 27  GLUCOSE 101* 96  BUN 21 16  CREATININE 0.66 0.50  CALCIUM 9.7 8.0*    Liver Function Tests: Recent Labs  Lab 09/01/18 1406  AST 19  ALT 18  ALKPHOS 66  BILITOT 0.8  PROT 8.1  ALBUMIN 4.7   No results for input(s): LIPASE, AMYLASE in  the last 168 hours. No results for input(s): AMMONIA in the last 168 hours.  CBC: Recent Labs  Lab 09/01/18 1406 09/02/18 0415  WBC 11.3* 5.9  NEUTROABS 9.2*  --   HGB 15.6* 12.5  HCT 48.6* 38.8  MCV 85.9 85.1  PLT 213 182    Cardiac Enzymes: Recent Labs  Lab 09/01/18 1406  TROPONINI <0.03    BNP: Invalid input(s): POCBNP  CBG: No results for input(s): GLUCAP in the last 168 hours.  Microbiology: Results for orders placed or performed in visit on 08/19/18  Urine Culture     Status: Abnormal   Collection Time: 08/19/18  2:05 PM  Result Value Ref Range Status   Urine Culture, Routine Final report (A)  Final   Organism ID, Bacteria Klebsiella pneumoniae (A)  Final    Comment: 50,000-100,000 colony forming units per mL Cefazolin <=4 ug/mL Cefazolin with an MIC <=16 predicts susceptibility to the oral agents cefaclor, cefdinir, cefpodoxime, cefprozil, cefuroxime, cephalexin, and loracarbef when used for therapy of uncomplicated urinary tract infections due to E. coli, Klebsiella pneumoniae, and Proteus mirabilis.    Antimicrobial Susceptibility Comment  Final    Comment:       ** S = Susceptible; I = Intermediate; R = Resistant **                    P = Positive; N = Negative             MICS are expressed in micrograms per mL    Antibiotic                 RSLT#1    RSLT#2    RSLT#3    RSLT#4 Amoxicillin/Clavulanic Acid    S Ampicillin                     R Cefepime                       S Ceftriaxone                    S Cefuroxime                     S Ciprofloxacin                  S Ertapenem                      S Gentamicin                     S Imipenem                       S Levofloxacin  S Meropenem                      S Nitrofurantoin                 I Piperacillin/Tazobactam        S Tetracycline                   S Tobramycin                     S Trimethoprim/Sulfa             S     Coagulation Studies: Recent Labs     09/01/18 1406  LABPROT 12.0  INR 0.9    Urinalysis:  Recent Labs  Lab 09/01/18 1640  COLORURINE YELLOW*  LABSPEC 1.015  PHURINE 5.0  GLUCOSEU NEGATIVE  HGBUR SMALL*  BILIRUBINUR NEGATIVE  KETONESUR NEGATIVE  PROTEINUR NEGATIVE  NITRITE NEGATIVE  LEUKOCYTESUR NEGATIVE    Lipid Panel:    Component Value Date/Time   CHOL 243 (H) 09/27/2017 0845   TRIG 104 09/27/2017 0845   HDL 60 09/27/2017 0845   CHOLHDL 4.1 09/27/2017 0845   LDLCALC 162 (H) 09/27/2017 0845    HgbA1C:  Lab Results  Component Value Date   HGBA1C 5.6 02/04/2016    Urine Drug Screen:  No results found for: LABOPIA, COCAINSCRNUR, LABBENZ, AMPHETMU, THCU, LABBARB  Alcohol Level: No results for input(s): ETH in the last 168 hours.  Other results: EKG: normal EKG, normal sinus rhythm, unchanged from previous tracings. Vent. rate 83 BPM PR interval * ms QRS duration 96 ms QT/QTc 377/443 ms P-R-T axes 70 95 -11  Imaging: Ct Head Wo Contrast  Result Date: 09/01/2018 CLINICAL DATA:  Memory loss EXAM: CT HEAD WITHOUT CONTRAST TECHNIQUE: Contiguous axial images were obtained from the base of the skull through the vertex without intravenous contrast. COMPARISON:  MRI from 06/17/2008 FINDINGS: Brain: No evidence of acute infarction, hemorrhage, hydrocephalus, extra-axial collection or mass lesion/mass effect. Vascular: No hyperdense vessel or unexpected calcification. Skull: Normal. Negative for fracture or focal lesion. Sinuses/Orbits: No acute finding. Other: None. IMPRESSION: No acute intracranial abnormality noted. Electronically Signed   By: Alcide Clever M.D.   On: 09/01/2018 14:41   Mr Laqueta Jean AO Contrast  Result Date: 09/01/2018 CLINICAL DATA:  63 y/o F; transient global amnesia. History of Bell's palsy, hyperlipidemia, UTI. EXAM: MRI HEAD WITHOUT AND WITH CONTRAST TECHNIQUE: Multiplanar, multiecho pulse sequences of the brain and surrounding structures were obtained without and with intravenous contrast.  CONTRAST:  5 cc Gadavist COMPARISON:  09/01/2018 CT head FINDINGS: Brain: 2 punctate foci of reduced diffusion are present in the right posterior hippocampus (series 5, image 27 and 25) compatible with acute/early subacute infarction. No associated hemorrhage or mass effect. Punctate nonspecific T2 FLAIR hyperintensities in frontal subcortical white matter are compatible with minimal chronic microvascular ischemic changes. No abnormal susceptibility hypointensity to indicate intracranial hemorrhage. No extra-axial collection, hydrocephalus, mass effect, or herniation. After administration of intravenous contrast there is no abnormal enhancement of the brain. Vascular: Normal flow voids. Skull and upper cervical spine: Normal marrow signal. Sinuses/Orbits: Negative. Other: None. 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 No hemorrhage or mass effect. Otherwise unremarkable MRI of the brain for age. These results were called by telephone at the time of interpretation on 09/01/2018 at 9:39 pm to Dr. Allena Katz, who verbally acknowledged these results. Electronically Signed  By: Mitzi Hansen M.D.   On: 09/01/2018 21:43   US Carotid Bilateral  Result Date: 09/02/2018 CLINICAL DATA:  63 year old female with new punctate infarcts in the right posterior hippocampus. Assess for carotid artery disease. EXAM: BILATERAL CAROTID DUPLEX ULTRASOUND TECHNIQUE: Wallace Cullens scale imaging, color Doppler and duplex ultrasound were performed of bilateral carotid and vertebral arteries in the neck. COMPARISON:  Brain MRI 09/01/2018 FINDINGS: Criteria: Quantification of carotid stenosis is based on velocity parameters that correlate the residual internal carotid diameter with NASCET-based stenosis levels, using the diameter of the distal internal carotid lumen as the denominator for stenosis measurement. The following velocity measurements were obtained: RIGHT  ICA: 72/19 cm/sec CCA: 83/21 cm/sec SYSTOLIC ICA/CCA RATIO:  0.9 ECA:  100 cm/sec LEFT ICA: 87/37 cm/sec CCA: 97/24 cm/sec SYSTOLIC ICA/CCA RATIO:  0.9 ECA:  92 cm/sec RIGHT CAROTID ARTERY: No significant atherosclerotic plaque or evidence of stenosis. RIGHT VERTEBRAL ARTERY:  Patent with normal antegrade flow. LEFT CAROTID ARTERY: No significant atherosclerotic plaque or evidence of stenosis. LEFT VERTEBRAL ARTERY: Patent with normal antegrade flow. Mirror image Doppler artifact noted incidentally. IMPRESSION: No significant atherosclerotic plaque or evidence of stenosis in either internal carotid artery. Electronically Signed   By: Malachy Moan M.D.   On: 09/02/2018 08:32   Assessment: 63 y.o. female  with past medical history of hyperlipidemia, cervical dysplasia and Bell's palsy presenting to the ED on 09/01/2018 with chief complaints of acute onset amnesia.  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  Ultrasound carotid shows no significant atherosclerosis or hemodynamically significant stenosis.  Patient was not on any anticoagulation or antiplatelet prior to this event.  Stroke Risk Factors - family history and hyperlipidemia  Plan: 1. HgbA1c, fasting lipid panel, TSH, 2. PT consult, OT consult, Speech consult to see prior to d/c 3. Echocardiogram pending 4. Start Aspirin 81 mg/day  5. Aggressive lipid management with goal low density lipoprotein (LDL) <70 mg/dl 6. Follow up with outpatient Neurology will need prolonged cardiac monitoring 7. No further neurologic intervention is recommended at this time.  If further questions arise, please call or page at that time.  Thank you for allowing neurology to participate in the care of this patient.     This patient was staffed with Dr. Loretha Brasil, Doyle Askew who  personally evaluated patient, reviewed documentation and agreed with assessment and plan of care as above.  Webb Silversmith, DNP, FNP-BC Board certified Nurse Practitioner Neurology Department   09/02/2018, 9:19 AM

## 2018-09-02 NOTE — ED Notes (Signed)
Patient walked to bathroom at this time.

## 2018-09-02 NOTE — ED Notes (Signed)
Call to lab to add UDS to urine which is already in lab

## 2018-09-02 NOTE — ED Notes (Signed)
Admitting MD aware of new facial palsy.

## 2018-09-02 NOTE — Progress Notes (Signed)
*  PRELIMINARY RESULTS* Echocardiogram 2D Echocardiogram has been performed.  Darlene Yang 09/02/2018, 9:49 AM 

## 2018-09-02 NOTE — Discharge Summary (Signed)
[employer] [facility] [place] [facility]   PATIENT NAME: Darlene Yang    MR#:  [id number]  DATE OF BIRTH:  [birth year]  DATE OF ADMISSION:  09/01/2018   ADMITTING PHYSICIAN: No admitting provider for patient encounter.  DATE OF DISCHARGE: 09/02/2018  1:53 PM  PRIMARY CARE PHYSICIAN: [name], MD   ADMISSION DIAGNOSIS:  Confusion from [facility] DISCHARGE DIAGNOSIS:  Active Problems:   Acute CVA  SECONDARY DIAGNOSIS:   Past Medical History:  Diagnosis Date  . Abnormal Pap smear of cervix    s/p LEEP  . Bell's palsy    with right face affect  . Cervical dysplasia 1999  . Hyperlipemia   . UTI (urinary tract infection)    HOSPITAL COURSE:   Darlene Yang is a 63 year old female who presented to the ED with memory loss.  She went to a concert, but did not remember going.  Per her family she was not acting right and was asking a lot of questions over and over again.  CT head was negative for acute abnormalities.  MRI brain was positive for stroke in the hippocampus.  Echocardiogram was unremarkable.  Carotid ultrasound was unremarkable.  CTA head and neck were unremarkable.  She was seen by neurology, who started her on aspirin and Lipitor.  Neurology recommended long-term heart monitoring on discharge.  She was not seen by physical therapy, Occupational Therapy, or speech therapy because she did not have any identified needs in these areas.  She was able to ambulate around the emergency department and was able to eat and speak without any problems.  She was discharged home with neurology follow-up.  DISCHARGE CONDITIONS:  Acute CVA CONSULTS OBTAINED:  Neurology DRUG ALLERGIES:   Allergies  Allergen Reactions  . Prednisone   . Codeine Nausea Only   DISCHARGE MEDICATIONS:   Allergies as of 09/02/2018      Reactions   Prednisone    Codeine Nausea Only      Medication List    TAKE these medications   aspirin EC 81 MG tablet Take 1 tablet (81 mg total) by mouth  daily.   atorvastatin 40 MG tablet Commonly known as:  LIPITOR Take 1 tablet (40 mg total) by mouth daily.   cholecalciferol 1000 units tablet Commonly known as:  VITAMIN D Take 1,000 Units by mouth daily.   loratadine 10 MG tablet Commonly known as:  CLARITIN Take 10 mg by mouth daily.   multivitamin tablet Take 1 tablet by mouth daily.        DISCHARGE INSTRUCTIONS:  1.  Follow-up with PCP in 5 days 2.  Follow-up with neurology in 2 weeks 3.  Go to the cardiologist for Linq device placement 4.  Take aspirin 81 mg daily and Lipitor 40 mg daily. DIET:  Cardiac diet DISCHARGE CONDITION:  Stable ACTIVITY:  Activity as tolerated OXYGEN:  Home Oxygen: No.  Oxygen Delivery: room air DISCHARGE LOCATION:  home   If you experience worsening of your admission symptoms, develop shortness of breath, life threatening emergency, suicidal or homicidal thoughts you must seek medical attention immediately by calling 911 or calling your MD immediately  if symptoms less severe.  You Must read complete instructions/literature along with all the possible adverse reactions/side effects for all the Medicines you take and that have been prescribed to you. Take any new Medicines after you have completely understood and accpet all the possible adverse reactions/side effects.   Please note  You were cared for by a  hospitalist during your hospital stay. If you have any questions about your discharge medications or the care you received while you were in the hospital after you are discharged, you can call the unit and asked to speak with the hospitalist on call if the hospitalist that took care of you is not available. Once you are discharged, your primary care physician will handle any further medical issues. Please note that NO REFILLS for any discharge medications will be authorized once you are discharged, as it is imperative that you return to your primary care physician (or establish a  relationship with a primary care physician if you do not have one) for your aftercare needs so that they can reassess your need for medications and monitor your lab values.    On the day of Discharge:  VITAL SIGNS:  Blood pressure 115/72, pulse 80, temperature 97.9 F (36.6 C), temperature source Oral, resp. rate 16, height 5\' 2"  (1.575 m), weight 59 kg, SpO2 97 %. PHYSICAL EXAMINATION:  GENERAL:  63 y.o.-year-old patient lying in the bed with no acute distress.  EYES: Pupils equal, round, reactive to light and accommodation. No scleral icterus. Extraocular muscles intact.  HEENT: Head atraumatic, normocephalic. Oropharynx and nasopharynx clear.  NECK:  Supple, no jugular venous distention. No thyroid enlargement, no tenderness.  LUNGS: Normal breath sounds bilaterally, no wheezing, rales,rhonchi or crepitation. No use of accessory muscles of respiration.  CARDIOVASCULAR: S1, S2 normal. No murmurs, rubs, or gallops.  ABDOMEN: Soft, non-tender, non-distended. Bowel sounds present. No organomegaly or mass.  EXTREMITIES: No pedal edema, cyanosis, or clubbing.  NEUROLOGIC: Cranial nerves II through XII are intact. Muscle strength 5/5 in all extremities. Sensation intact. Gait not checked.  PSYCHIATRIC: The patient is alert and oriented x 3.  SKIN: No obvious rash, lesion, or ulcer.  DATA REVIEW:   CBC Recent Labs  Lab 09/02/18 0415  WBC 5.9  HGB 12.5  HCT 38.8  PLT 182    Chemistries  Recent Labs  Lab 09/01/18 1406 09/02/18 0415  NA 138 140  K 4.2 3.7  CL 102 108  CO2 28 27  GLUCOSE 101* 96  BUN 21 16  CREATININE 0.66 0.50  CALCIUM 9.7 8.0*  AST 19  --   ALT 18  --   ALKPHOS 66  --   BILITOT 0.8  --      Microbiology Results  Results for orders placed or performed in visit on 08/19/18  Urine Culture     Status: Abnormal   Collection Time: 08/19/18  2:05 PM  Result Value Ref Range Status   Urine Culture, Routine Final report (A)  Final   Organism ID, Bacteria  Klebsiella pneumoniae (A)  Final    Comment: 50,000-100,000 colony forming units per mL Cefazolin <=4 ug/mL Cefazolin with an MIC <=16 predicts susceptibility to the oral agents cefaclor, cefdinir, cefpodoxime, cefprozil, cefuroxime, cephalexin, and loracarbef when used for therapy of uncomplicated urinary tract infections due to E. coli, Klebsiella pneumoniae, and Proteus mirabilis.    Antimicrobial Susceptibility Comment  Final    Comment:       ** S = Susceptible; I = Intermediate; R = Resistant **                    P = Positive; N = Negative             MICS are expressed in micrograms per mL    Antibiotic  RSLT#1    RSLT#2    RSLT#3    RSLT#4 Amoxicillin/Clavulanic Acid    S Ampicillin                     R Cefepime                       S Ceftriaxone                    S Cefuroxime                     S Ciprofloxacin                  S Ertapenem                      S Gentamicin                     S Imipenem                       S Levofloxacin                   S Meropenem                      S Nitrofurantoin                 I Piperacillin/Tazobactam        S Tetracycline                   S Tobramycin                     S Trimethoprim/Sulfa             S     RADIOLOGY:  Ct Angio Head W Or Wo Contrast  Result Date: 09/02/2018 CLINICAL DATA:  Stroke right hippocampus EXAM: CT ANGIOGRAPHY HEAD TECHNIQUE: Multidetector CT imaging of the head was performed using the standard protocol during bolus administration of intravenous contrast. Multiplanar CT image reconstructions and MIPs were obtained to evaluate the vascular anatomy. CONTRAST:  28mL OMNIPAQUE IOHEXOL 350 MG/ML SOLN COMPARISON:  MRI head and CT head 09/01/2018 FINDINGS: CTA HEAD Anterior circulation: Cavernous carotid normal bilaterally without stenosis aneurysm or atherosclerotic disease. Anterior and middle cerebral arteries widely patent bilaterally Posterior circulation: Both vertebral arteries  patent to the basilar. PICA patent bilaterally. Basilar widely patent. Superior cerebellar and posterior cerebral arteries patent bilaterally. Fetal origin right posterior cerebral artery. Venous sinuses: Patent with normal enhancement Anatomic variants: None Delayed phase: Normal enhancement on delayed imaging. No acute abnormality. IMPRESSION: 1. Negative for emergent large vessel occlusion. No significant intracranial stenosis or vascular malformation 2. Small infarcts right hippocampus identified on MRI are not visualized on CT. Electronically Signed   By: Marlan Palau M.D.   On: 09/02/2018 10:05   Mr Laqueta Jean TG Contrast  Result Date: 09/01/2018 CLINICAL DATA:  63 y/o F; transient global amnesia. History of Bell's palsy, hyperlipidemia, UTI. EXAM: MRI HEAD WITHOUT AND WITH CONTRAST TECHNIQUE: Multiplanar, multiecho pulse sequences of the brain and surrounding structures were obtained without and with intravenous contrast. CONTRAST:  5 cc Gadavist COMPARISON:  09/01/2018 CT head FINDINGS: Brain: 2 punctate foci of reduced diffusion are present in the right posterior hippocampus (series 5, image 27 and 25) compatible with acute/early subacute infarction. No associated  hemorrhage or mass effect. Punctate nonspecific T2 FLAIR hyperintensities in frontal subcortical white matter are compatible with minimal chronic microvascular ischemic changes. No abnormal susceptibility hypointensity to indicate intracranial hemorrhage. No extra-axial collection, hydrocephalus, mass effect, or herniation. After administration of intravenous contrast there is no abnormal enhancement of the brain. Vascular: Normal flow voids. Skull and upper cervical spine: Normal marrow signal. Sinuses/Orbits: Negative. Other: None. 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 No hemorrhage or mass effect. Otherwise unremarkable MRI of the brain for age. These results were  called by telephone at the time of interpretation on 09/01/2018 at 9:39 pm to Dr. Allena Katz, who verbally acknowledged these results. Electronically Signed   By: Mitzi Hansen M.D.   On: 09/01/2018 21:43   US Carotid Bilateral  Result Date: 09/02/2018 CLINICAL DATA:  63 year old female with new punctate infarcts in the right posterior hippocampus. Assess for carotid artery disease. EXAM: BILATERAL CAROTID DUPLEX ULTRASOUND TECHNIQUE: Wallace Cullens scale imaging, color Doppler and duplex ultrasound were performed of bilateral carotid and vertebral arteries in the neck. COMPARISON:  Brain MRI 09/01/2018 FINDINGS: Criteria: Quantification of carotid stenosis is based on velocity parameters that correlate the residual internal carotid diameter with NASCET-based stenosis levels, using the diameter of the distal internal carotid lumen as the denominator for stenosis measurement. The following velocity measurements were obtained: RIGHT ICA: 72/19 cm/sec CCA: 83/21 cm/sec SYSTOLIC ICA/CCA RATIO:  0.9 ECA:  100 cm/sec LEFT ICA: 87/37 cm/sec CCA: 97/24 cm/sec SYSTOLIC ICA/CCA RATIO:  0.9 ECA:  92 cm/sec RIGHT CAROTID ARTERY: No significant atherosclerotic plaque or evidence of stenosis. RIGHT VERTEBRAL ARTERY:  Patent with normal antegrade flow. LEFT CAROTID ARTERY: No significant atherosclerotic plaque or evidence of stenosis. LEFT VERTEBRAL ARTERY: Patent with normal antegrade flow. Mirror image Doppler artifact noted incidentally. IMPRESSION: No significant atherosclerotic plaque or evidence of stenosis in either internal carotid artery. Electronically Signed   By: Malachy Moan M.D.   On: 09/02/2018 08:32     Management plans discussed with the patient, family and they are in agreement.  CODE STATUS: Prior   TOTAL TIME TAKING CARE OF THIS PATIENT: 35 minutes.    Jinny Blossom Mayo M.D on 09/02/2018 at 7:51 PM  Between 7am to 6pm - Pager - [phone]  After 6pm go to [email or web address] - Social research officer, government  [facility]  [employer] [place] [facility]  Office  [phone]  CC: Primary care physician; [name], MD   Note: This dictation was prepared with Dragon dictation along with smaller phrase technology. Any transcriptional errors that result from this process are unintentional.

## 2018-09-03 LAB — HIV ANTIBODY (ROUTINE TESTING W REFLEX): HIV Screen 4th Generation wRfx: NONREACTIVE

## 2018-09-03 LAB — LDL CHOLESTEROL, DIRECT: Direct LDL: 116 mg/dL — ABNORMAL HIGH (ref 0–99)

## 2018-09-04 ENCOUNTER — Ambulatory Visit: Payer: Managed Care, Other (non HMO) | Admitting: Adult Health

## 2018-09-04 ENCOUNTER — Ambulatory Visit
Admission: RE | Admit: 2018-09-04 | Discharge: 2018-09-04 | Disposition: A | Discharge status: Home / Self Care | Payer: Managed Care, Other (non HMO) | Source: Ambulatory Visit | Attending: Urology | Admitting: Urology

## 2018-09-04 VITALS — BP 138/82 | HR 80 | Temp 98.3°F | Resp 14

## 2018-09-04 DIAGNOSIS — R8290 Unspecified abnormal findings in urine: Secondary | ICD-10-CM

## 2018-09-04 DIAGNOSIS — R319 Hematuria, unspecified: Secondary | ICD-10-CM

## 2018-09-04 DIAGNOSIS — N1339 Other hydronephrosis: Secondary | ICD-10-CM

## 2018-09-04 LAB — POCT URINALYSIS DIPSTICK
Bilirubin, UA: NEGATIVE
GLUCOSE UA: NEGATIVE
Ketones, UA: NEGATIVE
Leukocytes, UA: NEGATIVE
Nitrite, UA: NEGATIVE
Protein, UA: NEGATIVE
Spec Grav, UA: >=1.03 — AB (ref 1.010–1.025)
Urobilinogen, UA: 0.2 E.U./dL
pH, UA: 5.5 (ref 5.0–8.0)

## 2018-09-04 MED ORDER — IOPAMIDOL (ISOVUE-300) INJECTION 61%
125.0000 mL | Freq: Once | INTRAVENOUS | Status: AC | PRN
  Administered 2018-09-04: 125 mL via INTRAVENOUS

## 2018-09-04 NOTE — Patient Instructions (Signed)
She has urology ordered CT scan on 09/04/18 and follow up with her primary care provider. She will keep all recommended follow ups with her primary care and specialists.  Urine culture pending From emergency room and she will follow up with her Primary care for results.   Hematuria, Adult Hematuria is blood in the urine. Blood may be visible in the urine, or it may be identified with a test. This condition can be caused by infections of the bladder, urethra, kidney, or prostate. Other possible causes include:  Kidney stones.  Cancer of the urinary tract.  Too much calcium in the urine.  Conditions that are passed from parent to child (inherited conditions).  Exercise that requires a lot of energy. Infections can usually be treated with medicine, and a kidney stone usually will pass through your urine. If neither of these is the cause of your hematuria, more tests may be needed to identify the cause of your symptoms. It is very important to tell your health care provider about any blood in your urine, even if it is painless or the blood stops without treatment. Blood in the urine, when it happens and then stops and then happens again, can be a symptom of a very serious condition, including cancer. There is no pain in the initial stages of many urinary cancers. Follow these instructions at home: Medicines  Take over-the-counter and prescription medicines only as told by your health care provider.  If you were prescribed an antibiotic medicine, take it as told by your health care provider. Do not stop taking the antibiotic even if you start to feel better. Eating and drinking  Drink enough fluid to keep your urine clear or pale yellow. It is recommended that you drink 3-4 quarts (2.8-3.8 L) a day. If you have been diagnosed with an infection, it is recommended that you drink cranberry juice in addition to large amounts of water.  Avoid caffeine, tea, and carbonated beverages. These tend to  irritate the bladder.  Avoid alcohol because it may irritate the prostate (men). General instructions  If you have been diagnosed with a kidney stone, follow your health care provider's instructions about straining your urine to catch the stone.  Empty your bladder often. Avoid holding urine for long periods of time.  If you are female: ? After a bowel movement, wipe from front to back and use each piece of toilet paper only once. ? Empty your bladder before and after sex.  Pay attention to any changes in your symptoms. Tell your health care provider about any changes or any new symptoms.  It is your responsibility to get your test results. Ask your health care provider, or the department performing the test, when your results will be ready.  Keep all follow-up visits as told by your health care provider. This is important. Contact a health care provider if:  You develop back pain.  You have a fever.  You have nausea or vomiting.  Your symptoms do not improve after 3 days.  Your symptoms get worse. Get help right away if:  You develop severe vomiting and are unable take medicine without vomiting.  You develop severe pain in your back or abdomen even though you are taking medicine.  You pass a large amount of blood in your urine.  You pass blood clots in your urine.  You feel very weak or like you might faint.  You faint. Summary  Hematuria is blood in the urine. It has many possible causes.  It is very important that you tell your health care provider about any blood in your urine, even if it is painless or the blood stops without treatment.  Take over-the-counter and prescription medicines only as told by your health care provider.  Drink enough fluid to keep your urine clear or pale yellow. This information is not intended to replace advice given to you by your health care provider. Make sure you discuss any questions you have with your health care  provider. Document Released: 06/19/2005 Document Revised: 07/22/2016 Document Reviewed: 07/22/2016 Elsevier Interactive Patient Education  2019 [employer].

## 2018-09-04 NOTE — Progress Notes (Signed)
[facility]  Subjective:     Patient ID: Darlene Yang, female   DOB: 1956/04/09, 63 y.o.   MRN: [id number]  HPI  Blood pressure 138/82, pulse 80, temperature 98.3 F (36.8 C), temperature source Oral, resp. rate 14, SpO2 98 %.  Allergies  Allergen Reactions  . Prednisone   . Codeine Nausea Only   Patient Active Problem List   Diagnosis Date Noted  . TGA (transient global amnesia) 09/01/2018  . Family history of malignant melanoma 01/27/2018  . Family history of thyroid disease 01/27/2018  . Hyperglycemia 01/27/2018  . Hyperlipidemia, mixed 01/27/2018  . Vitamin D insufficiency 01/27/2018  . Cervical dysplasia 07/03/1997     Current Outpatient Medications:  .  aspirin EC 81 MG tablet, Take 1 tablet (81 mg total) by mouth daily., Disp: 90 tablet, Rfl: 0 .  atorvastatin (LIPITOR) 40 MG tablet, Take 1 tablet (40 mg total) by mouth daily., Disp: 90 tablet, Rfl: 0 .  cholecalciferol (VITAMIN D) 1000 units tablet, Take 1,000 Units by mouth daily., Disp: , Rfl:  .  loratadine (CLARITIN) 10 MG tablet, Take 10 mg by mouth daily., Disp: , Rfl:  .  Multiple Vitamin (MULTIVITAMIN) tablet, Take 1 tablet by mouth daily., Disp: , Rfl:   Patient is a 63 year old female in no acute distress who comes to the clinic for a repeat of her urine dipstick. She is accompanied by her daughter to the visit.  She was treated for urinary tract infection seen on  08/19/18 and urine culture resulted  ID, Bacteria Klebsiella pneumoniaeAbnormal    And was treated with antibiotic Bactrim appropriate by culture and sensitivity.   She and daughter deny any urinary symptoms today. She has a history of chronic lower back pain and hydronephrosis. She is scheduled for CT scan of her kidneys today at 09/04/18 at 3:00pm.   She has follow up tomorrow with her primary care provider.  Her urine culture from hospital is still pending.   She was hospitalized on 09/01/2018 for  global transient anemia,  She is oriented to person place date and time today at visit.  She does not remember her spouse died in 2023-06-20 per daughter and reports " she has been this way since hospital admission"  Daughter and patient deny any new or worsening symptoms.     Review of Systems  Constitutional: Negative for activity change, appetite change, chills, diaphoresis, fatigue, fever and unexpected weight change.  HENT: Negative.   Respiratory: Negative.   Cardiovascular: Negative.   Gastrointestinal: Negative.   Genitourinary: Negative.   Musculoskeletal: Negative.   Skin: Negative.   Neurological: Negative for dizziness, tremors, seizures, syncope, facial asymmetry, speech difficulty, weakness, light-headedness, numbness and headaches.  Hematological: Negative.   Psychiatric/Behavioral:       MEMORY ISSUES - SEE PERTINENT HISTORY        Objective:   Physical Exam Constitutional:      General: She is not in acute distress.    Appearance: Normal appearance. She is not ill-appearing, toxic-appearing or diaphoretic.  HENT:     Head: Normocephalic and atraumatic.     Nose: Nose normal.  Eyes:     General: No scleral icterus.       Right eye: No discharge.        Left eye: No discharge.     Conjunctiva/sclera: Conjunctivae normal.  Neck:     Musculoskeletal: Normal range of motion and neck supple. No neck rigidity or muscular  tenderness.  Cardiovascular:     Pulses: Normal pulses.     Heart sounds: Normal heart sounds. No murmur. No friction rub. No gallop.   Pulmonary:     Effort: Pulmonary effort is normal. No respiratory distress.     Breath sounds: Normal breath sounds. No stridor. No wheezing, rhonchi or rales.  Chest:     Chest wall: No tenderness.  Abdominal:     General: There is no distension.     Palpations: Abdomen is soft. There is no mass.     Tenderness: There is no abdominal tenderness. There is no right CVA tenderness, left CVA tenderness,  guarding or rebound.     Hernia: No hernia is present.  Lymphadenopathy:     Cervical: No cervical adenopathy.  Skin:    General: Skin is warm and dry.     Capillary Refill: Capillary refill takes less than 2 seconds.  Neurological:     Mental Status: She is alert and oriented to person, place, and time.     Cranial Nerves: No cranial nerve deficit.     Sensory: No sensory deficit.     Motor: No weakness.     Coordination: Coordination normal.     Gait: Gait normal.     Deep Tendon Reflexes: Reflexes normal.  Psychiatric:        Mood and Affect: Mood normal.        Behavior: Behavior normal.        Judgment: Judgment normal.     Comments: Does not remember events that occurred in 2019-07-08- death of spouse.  Is oriented to person, place, time and date.  Answers questions appropriately.  Daughter present and denies any change since leaving hospital at discharge.         Assessment:     Abnormal finding on urinalysis- recheck after previous UTI - Plan: POCT Urinalysis Dipstick (CPT 81002)      Plan:     Darlene Yang was seen today for f/u from 08/19/18 for uti.  Diagnoses and all orders for this visit:  Abnormal finding on urinalysis- recheck after previous UTI -     POCT Urinalysis Dipstick (CPT 81002) -     Urine Culture  Follow up with your Darlene Humphrey, MD within 1-2 days regarding pending urine culture from hospital.    Keep follow up with your urologist. neurologist, cardiologist and schedule sooner follow up with your Darlene Humphrey, MD primary care.   Advised daughter and patient would be best for follow ups to take place with patients specialists and primary care given patients chronic and complex recent history.   Advised emergency room if any new symptoms occur or changes in behavior   or any RED FLAGS as discussed with daughter and patients.   Patient should not follow up with the acute care clinic she should see her Darlene Humphrey, MD  Patient  And  daughter verbalized understanding of all instructions given and denies any further questions at this time.

## 2018-09-05 LAB — URINE CULTURE
Culture: 10000 — AB
Special Requests: NORMAL

## 2018-09-06 ENCOUNTER — Telehealth: Payer: Self-pay

## 2018-09-06 LAB — URINE CULTURE

## 2018-09-06 NOTE — Telephone Encounter (Signed)
Contacted the patient and informed her about her negative urine culture. She gave verbal understanding

## 2018-09-09 ENCOUNTER — Telehealth: Payer: Self-pay

## 2018-09-09 NOTE — Telephone Encounter (Signed)
-----   Message from Riki Altes, MD sent at 09/09/2018  7:55 AM EDT ----- CT showed resolution of previously noted hydronephrosis with only mild fullness of the right collecting system.  Keep 3/24 follow-up appointment.

## 2018-09-12 ENCOUNTER — Encounter: Payer: Self-pay | Admitting: Internal Medicine

## 2018-09-23 ENCOUNTER — Ambulatory Visit: Payer: Self-pay | Admitting: Internal Medicine

## 2018-09-24 ENCOUNTER — Ambulatory Visit: Payer: Managed Care, Other (non HMO) | Admitting: Urology

## 2018-10-01 ENCOUNTER — Ambulatory Visit: Payer: Managed Care, Other (non HMO) | Admitting: Urology

## 2018-10-03 ENCOUNTER — Telehealth: Payer: Managed Care, Other (non HMO) | Admitting: Urology

## 2018-10-03 ENCOUNTER — Telehealth (INDEPENDENT_AMBULATORY_CARE_PROVIDER_SITE_OTHER): Payer: Managed Care, Other (non HMO) | Admitting: Urology

## 2018-10-03 ENCOUNTER — Other Ambulatory Visit: Payer: Self-pay

## 2018-10-03 DIAGNOSIS — N132 Hydronephrosis with renal and ureteral calculous obstruction: Secondary | ICD-10-CM

## 2018-10-03 NOTE — Progress Notes (Signed)
Virtual Visit via Telephone Note  I connected with Darlene Yang on 10/03/2018 at  2:00 PM EDT by telephone and verified that I am speaking with the correct person using two identifiers.   I discussed the limitations, risks, security and privacy concerns of performing an evaluation and management service by telephone and the availability of in person appointments. We discussed the impact of the COVID-19 on the healthcare system, and the importance of social distancing and reducing patient and provider exposure. I also discussed with the patient that there may be a patient responsible charge related to this service. The patient expressed understanding and agreed to proceed.  Reason for visit: Review of follow-up CT  History of Present Illness: 63 year old female with a history of recurrent UTI.  She was seen by Carollee Herter in late October 2019 and was also noted to have microhematuria.  CT urogram showed mild right hydronephrosis.  Cystoscopy was unremarkable.  A follow-up CT was recommended which was performed on 09/04/2018 which showed resolution of her right hydronephrosis.  There was minimal fullness of the right collecting system.  No hydroureter was seen.  She denies flank/abdominal/pelvic pain.  She has no voiding complaints and denies gross hematuria.  She was treated for UTI with 2 courses of antibiotics in late February.  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  Assessment and Plan: 63 year old female with a history recurrent UTI and microhematuria.  Follow-up CT showed resolution of her mild hydronephrosis.  She inquired about preventative measures for her recurrent UTIs.  Carollee Herter had previously discussed low-dose vaginal estrogen which she would like to hold off.  She is not taking cranberry and I initially recommended cranberry tablets.  Follow Up: 3 to 4 months for symptom check and repeat urinalysis.   I discussed the assessment and treatment plan with the patient. The patient was  provided an opportunity to ask questions and all were answered. The patient agreed with the plan and demonstrated an understanding of the instructions.   The patient was advised to call back or seek an in-person evaluation if the symptoms worsen or if the condition fails to improve as anticipated.  I provided 10 minutes of non-face-to-face time during this encounter.   Riki Altes, MD

## 2018-10-17 ENCOUNTER — Ambulatory Visit: Payer: Self-pay | Admitting: Internal Medicine

## 2018-10-23 NOTE — Addendum Note (Signed)
Addended by: Berniece Pap on: 10/23/2018 02:03 PM   Modules accepted: Level of Service

## 2018-10-29 DIAGNOSIS — E7800 Pure hypercholesterolemia, unspecified: Secondary | ICD-10-CM | POA: Insufficient documentation

## 2018-10-29 DIAGNOSIS — E559 Vitamin D deficiency, unspecified: Secondary | ICD-10-CM | POA: Insufficient documentation

## 2018-11-04 ENCOUNTER — Encounter: Payer: Self-pay | Admitting: Adult Health

## 2018-11-04 ENCOUNTER — Ambulatory Visit: Payer: Managed Care, Other (non HMO) | Admitting: Adult Health

## 2018-11-04 ENCOUNTER — Other Ambulatory Visit: Payer: Self-pay

## 2018-11-04 VITALS — BP 110/72 | HR 72 | Temp 98.5°F | Resp 16 | Ht 62.0 in | Wt 131.0 lb

## 2018-11-04 DIAGNOSIS — Z0189 Encounter for other specified special examinations: Secondary | ICD-10-CM

## 2018-11-04 DIAGNOSIS — Z008 Encounter for other general examination: Secondary | ICD-10-CM

## 2018-11-04 NOTE — Progress Notes (Signed)
[facility]  JOLIYAH WOJNO DOB: 63 y.o. MRN: [id number]  Subjective:  Here for Biometric Screen/brief exam She reports she is doing well and is keeping her follow up appointments with neurology after her episode of transient aphasia earlier this year. She denies any further episodes and has been cleared to return to work and drive she reports.   Patient  denies any fever, body aches,chills, rash, chest pain, shortness of breath, nausea, vomiting, or diarrhea.   She denies any concerns at this time.   Patient Active Problem List   Diagnosis Date Noted  . Pure hypercholesterolemia 10/29/2018  . Vitamin D deficiency 10/29/2018  . TGA (transient global amnesia) 09/01/2018  . Family history of malignant melanoma 01/27/2018  . Family history of thyroid disease 01/27/2018  . Hyperglycemia 01/27/2018  . Hyperlipidemia, mixed 01/27/2018  . Vitamin D insufficiency 01/27/2018  . Cervical dysplasia 07/03/1997    Current Outpatient Medications:  .  aspirin EC 81 MG tablet, Take 1 tablet (81 mg total) by mouth daily., Disp: 90 tablet, Rfl: 0 .  atorvastatin (LIPITOR) 40 MG tablet, Take 1 tablet (40 mg total) by mouth daily., Disp: 90 tablet, Rfl: 0 .  cetirizine (ZYRTEC) 10 MG tablet, Take by mouth., Disp: , Rfl:  .  cholecalciferol (VITAMIN D) 1000 units tablet, Take 1,000 Units by mouth daily., Disp: , Rfl:  .  Multiple Vitamin (MULTIVITAMIN) tablet, Take 1 tablet by mouth daily., Disp: , Rfl:   Objective: Blood pressure 110/72, pulse 72, temperature 98.5 F (36.9 C), temperature source Oral, resp. rate 16, height 5\' 2"  (1.575 m), weight 131 lb (59.4 kg), SpO2 99 %. NAD alert and oriented x 3  HEENT: Within normal limits Neck: Normal Heart: Regular rate and rhythm Lungs: Clear  Assessment: Biometric screen   Plan:  Fasting glucose and lipids. Discussed with patient that today's visit here is a limited biometric screening visit (not a  comprehensive exam or management of any chronic problems) Discussed some health issues, including healthy eating habits and exercise. Encouraged to follow-up with PCP for annual comprehensive preventive and wellness care (and if applicable, any chronic issues). Questions invited and answered.   Follow up with primary care as needed for chronic and maintenance health care- can be seen in this employee clinic for acute care.

## 2018-11-04 NOTE — Patient Instructions (Signed)
I will have the office call you on your glucose and cholesterol results when they return if you have not heard within 1 week please call the office.  This biometric physical is a brief physical and the only labs done are glucose and your lipid panel(cholesterol) and is  not a substitute for seeing a primary care provider for a complete annual physical. Please see a primary care physician for routine health maintenance, labs and full physical at least yearly and follow up as recommended by your provider. Provider also recommends if you do not have a primary care provider for patient to establish care as soon as possible .Patient may chose provider of choice. Also gave the [place]  PHYSICIAN/PROVIDER  REFERRAL LINE at [phone] or web site at [email or web address] to help assist with finding a primary care doctor.  Patient verbalizes understanding that his office is acute care only and not a substitute for a primary care or for the management of chronic conditions.    Follow up with primary care as needed for chronic and maintenance health care- can be seen in this employee clinic for acute care.    Health Maintenance, Female Adopting a healthy lifestyle and getting preventive care can go a long way to promote health and wellness. Talk with your health care provider about what schedule of regular examinations is right for you. This is a good chance for you to check in with your provider about disease prevention and staying healthy. In between checkups, there are plenty of things you can do on your own. Experts have done a lot of research about which lifestyle changes and preventive measures are most likely to keep you healthy. Ask your health care provider for more information. Weight and diet Eat a healthy diet  Be sure to include plenty of vegetables, fruits, low-fat dairy products, and lean protein.  Do not eat a lot of foods high in solid fats, added sugars, or salt.  Get regular exercise.  This is one of the most important things you can do for your health. ? Most adults should exercise for at least 150 minutes each week. The exercise should increase your heart rate and make you sweat (moderate-intensity exercise). ? Most adults should also do strengthening exercises at least twice a week. This is in addition to the moderate-intensity exercise. Maintain a healthy weight  Body mass index (BMI) is a measurement that can be used to identify possible weight problems. It estimates body fat based on height and weight. Your health care provider can help determine your BMI and help you achieve or maintain a healthy weight.  For females 20 years of age and older: ? A BMI below 18.5 is considered underweight. ? A BMI of 18.5 to 24.9 is normal. ? A BMI of 25 to 29.9 is considered overweight. ? A BMI of 30 and above is considered obese. Watch levels of cholesterol and blood lipids  You should start having your blood tested for lipids and cholesterol at 64 years of age, then have this test every 5 years.  You may need to have your cholesterol levels checked more often if: ? Your lipid or cholesterol levels are high. ? You are older than 64 years of age. ? You are at high risk for heart disease. Cancer screening Lung Cancer  Lung cancer screening is recommended for adults 55-80 years old who are at high risk for lung cancer because of a history of smoking.  A yearly low-dose CT scan   of the lungs is recommended for people who: ? Currently smoke. ? Have quit within the past 15 years. ? Have at least a 30-pack-year history of smoking. A pack year is smoking an average of one pack of cigarettes a day for 1 year.  Yearly screening should continue until it has been 15 years since you quit.  Yearly screening should stop if you develop a health problem that would prevent you from having lung cancer treatment. Breast Cancer  Practice breast self-awareness. This means understanding how your  breasts normally appear and feel.  It also means doing regular breast self-exams. Let your health care provider know about any changes, no matter how small.  If you are in your 20s or 30s, you should have a clinical breast exam (CBE) by a health care provider every 1-3 years as part of a regular health exam.  If you are 40 or older, have a CBE every year. Also consider having a breast X-ray (mammogram) every year.  If you have a family history of breast cancer, talk to your health care provider about genetic screening.  If you are at high risk for breast cancer, talk to your health care provider about having an MRI and a mammogram every year.  Breast cancer gene (BRCA) assessment is recommended for women who have family members with BRCA-related cancers. BRCA-related cancers include: ? Breast. ? Ovarian. ? Tubal. ? Peritoneal cancers.  Results of the assessment will determine the need for genetic counseling and BRCA1 and BRCA2 testing. Cervical Cancer Your health care provider may recommend that you be screened regularly for cancer of the pelvic organs (ovaries, uterus, and vagina). This screening involves a pelvic examination, including checking for microscopic changes to the surface of your cervix (Pap test). You may be encouraged to have this screening done every 3 years, beginning at age 21.  For women ages 30-65, health care providers may recommend pelvic exams and Pap testing every 3 years, or they may recommend the Pap and pelvic exam, combined with testing for human papilloma virus (HPV), every 5 years. Some types of HPV increase your risk of cervical cancer. Testing for HPV may also be done on women of any age with unclear Pap test results.  Other health care providers may not recommend any screening for nonpregnant women who are considered low risk for pelvic cancer and who do not have symptoms. Ask your health care provider if a screening pelvic exam is right for you.  If you  have had past treatment for cervical cancer or a condition that could lead to cancer, you need Pap tests and screening for cancer for at least 20 years after your treatment. If Pap tests have been discontinued, your risk factors (such as having a new sexual partner) need to be reassessed to determine if screening should resume. Some women have medical problems that increase the chance of getting cervical cancer. In these cases, your health care provider may recommend more frequent screening and Pap tests. Colorectal Cancer  This type of cancer can be detected and often prevented.  Routine colorectal cancer screening usually begins at 64 years of age and continues through 64 years of age.  Your health care provider may recommend screening at an earlier age if you have risk factors for colon cancer.  Your health care provider may also recommend using home test kits to check for hidden blood in the stool.  A small camera at the end of a tube can be used to examine   your colon directly (sigmoidoscopy or colonoscopy). This is done to check for the earliest forms of colorectal cancer.  Routine screening usually begins at age 19.  Direct examination of the colon should be repeated every 5-10 years through 64 years of age. However, you may need to be screened more often if early forms of precancerous polyps or small growths are found. Skin Cancer  Check your skin from head to toe regularly.  Tell your health care provider about any new moles or changes in moles, especially if there is a change in a mole's shape or color.  Also tell your health care provider if you have a mole that is larger than the size of a pencil eraser.  Always use sunscreen. Apply sunscreen liberally and repeatedly throughout the day.  Protect yourself by wearing long sleeves, pants, a wide-brimmed hat, and sunglasses whenever you are outside. Heart disease, diabetes, and high blood pressure  High blood pressure causes heart  disease and increases the risk of stroke. High blood pressure is more likely to develop in: ? People who have blood pressure in the high end of the normal range (130-139/85-89 mm Hg). ? People who are overweight or obese. ? People who are African American.  If you are 40-48 years of age, have your blood pressure checked every 3-5 years. If you are 41 years of age or older, have your blood pressure checked every year. You should have your blood pressure measured twice-once when you are at a hospital or clinic, and once when you are not at a hospital or clinic. Record the average of the two measurements. To check your blood pressure when you are not at a hospital or clinic, you can use: ? An automated blood pressure machine at a pharmacy. ? A home blood pressure monitor.  If you are between 40 years and 64 years old, ask your health care provider if you should take aspirin to prevent strokes.  Have regular diabetes screenings. This involves taking a blood sample to check your fasting blood sugar level. ? If you are at a normal weight and have a low risk for diabetes, have this test once every three years after 63 years of age. ? If you are overweight and have a high risk for diabetes, consider being tested at a younger age or more often. Preventing infection Hepatitis B  If you have a higher risk for hepatitis B, you should be screened for this virus. You are considered at high risk for hepatitis B if: ? You were born in a country where hepatitis B is common. Ask your health care provider which countries are considered high risk. ? Your parents were born in a high-risk country, and you have not been immunized against hepatitis B (hepatitis B vaccine). ? You have HIV or AIDS. ? You use needles to inject street drugs. ? You live with someone who has hepatitis B. ? You have had sex with someone who has hepatitis B. ? You get hemodialysis treatment. ? You take certain medicines for conditions,  including cancer, organ transplantation, and autoimmune conditions. Hepatitis C  Blood testing is recommended for: ? Everyone born from [birth year] through 1966. ? Anyone with known risk factors for hepatitis C. Sexually transmitted infections (STIs)  You should be screened for sexually transmitted infections (STIs) including gonorrhea and chlamydia if: ? You are sexually active and are younger than 64 years of age. ? You are older than 64 years of age and your health care provider tells  you that you are at risk for this type of infection. ? Your sexual activity has changed since you were last screened and you are at an increased risk for chlamydia or gonorrhea. Ask your health care provider if you are at risk.  If you do not have HIV, but are at risk, it may be recommended that you take a prescription medicine daily to prevent HIV infection. This is called pre-exposure prophylaxis (PrEP). You are considered at risk if: ? You are sexually active and do not regularly use condoms or know the HIV status of your partner(s). ? You take drugs by injection. ? You are sexually active with a partner who has HIV. Talk with your health care provider about whether you are at high risk of being infected with HIV. If you choose to begin PrEP, you should first be tested for HIV. You should then be tested every 3 months for as long as you are taking PrEP. Pregnancy  If you are premenopausal and you may become pregnant, ask your health care provider about preconception counseling.  If you may become pregnant, take 400 to 800 micrograms (mcg) of folic acid every day.  If you want to prevent pregnancy, talk to your health care provider about birth control (contraception). Osteoporosis and menopause  Osteoporosis is a disease in which the bones lose minerals and strength with aging. This can result in serious bone fractures. Your risk for osteoporosis can be identified using a bone density scan.  If you are 81  years of age or older, or if you are at risk for osteoporosis and fractures, ask your health care provider if you should be screened.  Ask your health care provider whether you should take a calcium or vitamin D supplement to lower your risk for osteoporosis.  Menopause may have certain physical symptoms and risks.  Hormone replacement therapy may reduce some of these symptoms and risks. Talk to your health care provider about whether hormone replacement therapy is right for you. Follow these instructions at home:  Schedule regular health, dental, and eye exams.  Stay current with your immunizations.  Do not use any tobacco products including cigarettes, chewing tobacco, or electronic cigarettes.  If you are pregnant, do not drink alcohol.  If you are breastfeeding, limit how much and how often you drink alcohol.  Limit alcohol intake to no more than 1 drink per day for nonpregnant women. One drink equals 12 ounces of beer, 5 ounces of wine, or 1 ounces of hard liquor.  Do not use street drugs.  Do not share needles.  Ask your health care provider for help if you need support or information about quitting drugs.  Tell your health care provider if you often feel depressed.  Tell your health care provider if you have ever been abused or do not feel safe at home. This information is not intended to replace advice given to you by your health care provider. Make sure you discuss any questions you have with your health care provider. Document Released: 01/02/2011 Document Revised: 11/25/2015 Document Reviewed: 03/23/2015 Elsevier Interactive Patient Education  2019 [employer].

## 2018-11-05 ENCOUNTER — Encounter: Payer: Self-pay | Admitting: Adult Health

## 2018-11-05 LAB — LIPID PANEL WITH LDL/HDL RATIO
Cholesterol, Total: 118 mg/dL (ref 100–199)
HDL: 56 mg/dL (ref >39)
LDL Calculated: 52 mg/dL (ref 0–99)
LDl/HDL Ratio: 0.9 ratio (ref 0.0–3.2)
Triglycerides: 49 mg/dL (ref 0–149)
VLDL Cholesterol Cal: 10 mg/dL (ref 5–40)

## 2018-11-05 LAB — GLUCOSE, RANDOM: Glucose: 97 mg/dL (ref 65–99)

## 2018-11-26 ENCOUNTER — Telehealth: Payer: Self-pay | Admitting: Urology

## 2018-11-26 DIAGNOSIS — R300 Dysuria: Secondary | ICD-10-CM

## 2018-11-26 NOTE — Addendum Note (Signed)
Addended by: Honor Loh on: 11/26/2018 04:11 PM   Modules accepted: Orders

## 2018-11-26 NOTE — Telephone Encounter (Signed)
Lab appointment has been scheduled for UA, UCX

## 2018-11-26 NOTE — Telephone Encounter (Signed)
Pt is having low back pain, burning with urination and she is having a lot of leg pain. She is very tired.

## 2018-11-27 ENCOUNTER — Other Ambulatory Visit: Payer: Managed Care, Other (non HMO)

## 2018-11-27 ENCOUNTER — Other Ambulatory Visit: Payer: Self-pay

## 2018-11-27 DIAGNOSIS — R300 Dysuria: Secondary | ICD-10-CM

## 2018-11-27 LAB — URINALYSIS, COMPLETE
Bilirubin, UA: NEGATIVE
Glucose, UA: NEGATIVE
Ketones, UA: NEGATIVE
Leukocytes,UA: NEGATIVE
Nitrite, UA: POSITIVE — AB
Protein,UA: NEGATIVE
Specific Gravity, UA: <1.005 — ABNORMAL LOW (ref 1.005–1.030)
Urobilinogen, Ur: 0.2 mg/dL (ref 0.2–1.0)
pH, UA: 5.5 (ref 5.0–7.5)

## 2018-11-27 LAB — MICROSCOPIC EXAMINATION
Bacteria, UA: NONE SEEN
RBC, Urine: NONE SEEN /hpf (ref 0–2)

## 2018-11-27 NOTE — Telephone Encounter (Signed)
UA was done today. It does not look bad although it says nitrite positive. Will you look at it and see if you want to give an ABX.

## 2018-11-28 NOTE — Telephone Encounter (Signed)
Informed patient as instructed-verbalized understanding. Patient aware once we receive results will notify her.

## 2018-11-28 NOTE — Telephone Encounter (Signed)
Based on microscopy I would wait on urine culture results before prescribing antibiotics.

## 2018-11-29 LAB — CULTURE, URINE COMPREHENSIVE

## 2018-12-02 ENCOUNTER — Telehealth: Payer: Self-pay

## 2018-12-02 NOTE — Telephone Encounter (Signed)
-----   Message from Riki Altes, MD sent at 11/30/2018  2:15 PM EDT ----- Urine culture was negative for infection

## 2019-02-06 ENCOUNTER — Ambulatory Visit: Payer: Managed Care, Other (non HMO) | Admitting: Urology

## 2019-02-10 ENCOUNTER — Other Ambulatory Visit: Payer: Self-pay | Admitting: Internal Medicine

## 2019-02-10 DIAGNOSIS — Z1231 Encounter for screening mammogram for malignant neoplasm of breast: Secondary | ICD-10-CM

## 2019-04-01 ENCOUNTER — Ambulatory Visit
Admission: RE | Admit: 2019-04-01 | Discharge: 2019-04-01 | Disposition: A | Discharge status: Home / Self Care | Payer: Managed Care, Other (non HMO) | Source: Ambulatory Visit | Attending: Internal Medicine | Admitting: Internal Medicine

## 2019-04-01 DIAGNOSIS — Z1231 Encounter for screening mammogram for malignant neoplasm of breast: Secondary | ICD-10-CM | POA: Diagnosis present

## 2019-05-21 ENCOUNTER — Other Ambulatory Visit: Payer: Self-pay

## 2019-05-21 ENCOUNTER — Other Ambulatory Visit (HOSPITAL_COMMUNITY)
Admission: RE | Admit: 2019-05-21 | Discharge: 2019-05-21 | Disposition: A | Discharge status: Home / Self Care | Payer: Managed Care, Other (non HMO) | Source: Ambulatory Visit | Attending: Obstetrics and Gynecology | Admitting: Obstetrics and Gynecology

## 2019-05-21 ENCOUNTER — Ambulatory Visit (INDEPENDENT_AMBULATORY_CARE_PROVIDER_SITE_OTHER): Payer: Managed Care, Other (non HMO) | Admitting: Obstetrics and Gynecology

## 2019-05-21 ENCOUNTER — Encounter: Payer: Self-pay | Admitting: Obstetrics and Gynecology

## 2019-05-21 VITALS — BP 120/80 | Ht 62.0 in | Wt 134.0 lb

## 2019-05-21 DIAGNOSIS — R102 Pelvic and perineal pain unspecified side: Secondary | ICD-10-CM

## 2019-05-21 DIAGNOSIS — N3001 Acute cystitis with hematuria: Secondary | ICD-10-CM

## 2019-05-21 DIAGNOSIS — Z1151 Encounter for screening for human papillomavirus (HPV): Secondary | ICD-10-CM | POA: Diagnosis present

## 2019-05-21 DIAGNOSIS — Z87410 Personal history of cervical dysplasia: Secondary | ICD-10-CM | POA: Diagnosis present

## 2019-05-21 DIAGNOSIS — Z124 Encounter for screening for malignant neoplasm of cervix: Secondary | ICD-10-CM | POA: Insufficient documentation

## 2019-05-21 LAB — POCT URINALYSIS DIPSTICK
Bilirubin, UA: NEGATIVE
Glucose, UA: NEGATIVE
Ketones, UA: NEGATIVE
Leukocytes, UA: NEGATIVE
Nitrite, UA: NEGATIVE
Protein, UA: NEGATIVE
Spec Grav, UA: 1.025 (ref 1.010–1.025)
pH, UA: 5 (ref 5.0–8.0)

## 2019-05-21 MED ORDER — NITROFURANTOIN MONOHYD MACRO 100 MG PO CAPS: 100.0000 mg | ORAL_CAPSULE | Freq: Two times a day (BID) | ORAL | 0 refills | Status: AC

## 2019-05-21 NOTE — Patient Instructions (Signed)
I value your feedback and entrusting us with your care. If you get a [place] patient survey, I would appreciate you taking the time to let us know about your experience today. Thank you! 

## 2019-05-21 NOTE — Progress Notes (Signed)
Darlene Humphrey, MD   Chief Complaint  Patient presents with  . Pelvic Pain    urinary frequency, no blood in urine or back pain/no burning x 6 weeks at least    HPI:      Darlene Yang is a 63 y.o. G2P2 who LMP was No LMP recorded. Patient has had a hysterectomy., presents today for urinary frequency, urgency, pelvic pain/pressure and LBP for the past 6 wks. No hematuria, dysuria. No vag sx, no vag bleeding. Sx getting worse. Pt has a hx of recurrent UTIs, treated at work. Referred to Dr. Lonna Cobb several yrs ago for recurrent sx and had neg CT scan twice and neg cystoscopy. Saw [facility] urology with telehealth appt recently and was told she didn't have UTIs afterall (neg C&S). Started on femring but pt couldn't tolerate, then did estrace crm for a month. Had neg C&S with Dr. Lonna Cobb 5/20.  S/p TVHBSO for CPP, ovar cysts in past. Also with hx of LEEP 1999. Last pap neg 7/17. Due for pap this yr.    Patient Active Problem List   Diagnosis Date Noted  . Pure hypercholesterolemia 10/29/2018  . Vitamin D deficiency 10/29/2018  . TGA (transient global amnesia) 09/01/2018  . Family history of malignant melanoma 01/27/2018  . Family history of thyroid disease 01/27/2018  . Hyperglycemia 01/27/2018  . Hyperlipidemia, mixed 01/27/2018  . Vitamin D insufficiency 01/27/2018  . Cervical dysplasia 07/03/1997    Past Surgical History:  Procedure Laterality Date  . BREAST BIOPSY Left 1999   benign needle bx per pt  . LEEP  1999  . torn retina    . TOTAL VAGINAL HYSTERECTOMY  1999   TVHBSO due to cysts/CPP [facility]  . TUBAL LIGATION      Family History  Problem Relation Age of Onset  . Hypertension Mother   . Hyperlipidemia Mother   . CAD Father   . Hypertension Father   . Hyperlipidemia Father   . Stroke Father   . Breast cancer Neg Hx     Social History   Socioeconomic History  . Marital status: Married    Spouse name: Not on file  . Number of children: 2  . Years of  education: Not on file  . Highest education level: Not on file  Occupational History  . Not on file  Social Needs  . Financial resource strain: Not on file  . Food insecurity    Worry: Not on file    Inability: Not on file  . Transportation needs    Medical: Not on file    Non-medical: Not on file  Tobacco Use  . Smoking status: Never Smoker  . Smokeless tobacco: Never Used  Substance and Sexual Activity  . Alcohol use: No    Alcohol/week: 0.0 standard drinks  . Drug use: No  . Sexual activity: Not Currently    Birth control/protection: Surgical    Comment: hysterectomy  Lifestyle  . Physical activity    Days per week: Not on file    Minutes per session: Not on file  . Stress: Not on file  Relationships  . Social Musician on phone: Not on file    Gets together: Not on file    Attends religious service: Not on file    Active member of club or organization: Not on file    Attends meetings of clubs or organizations: Not on file    Relationship status: Not on file  .  Intimate partner violence    Fear of current or ex partner: Not on file    Emotionally abused: Not on file    Physically abused: Not on file    Forced sexual activity: Not on file  Other Topics Concern  . Not on file  Social History Narrative  . Not on file    Outpatient Medications Prior to Visit  Medication Sig Dispense Refill  . aspirin EC 81 MG tablet Take by mouth.    . cetirizine (ZYRTEC) 10 MG tablet Take by mouth.    . cholecalciferol (VITAMIN D) 1000 units tablet Take 1,000 Units by mouth daily.    Marland Kitchen. estradiol (ESTRACE) 0.1 MG/GM vaginal cream Place vaginally.    . Multiple Vitamin (MULTIVITAMIN) tablet Take 1 tablet by mouth daily.    Marland Kitchen. atorvastatin (LIPITOR) 40 MG tablet Take 1 tablet (40 mg total) by mouth daily. 90 tablet 0   No facility-administered medications prior to visit.       ROS:  Review of Systems  Constitutional: Negative for fever.  Gastrointestinal:  Negative for blood in stool, constipation, diarrhea, nausea and vomiting.  Genitourinary: Positive for frequency, pelvic pain and urgency. Negative for dyspareunia, dysuria, flank pain, hematuria, vaginal bleeding, vaginal discharge and vaginal pain.  Musculoskeletal: Positive for back pain.  Skin: Negative for rash.  BREAST: No symptoms   OBJECTIVE:   Vitals:  BP 120/80   Ht 5\' 2"  (1.575 m)   Wt 134 lb (60.8 kg)   BMI 24.51 kg/m   Physical Exam Vitals signs reviewed.  Constitutional:      Appearance: She is well-developed. She is not ill-appearing or toxic-appearing.  Neck:     Musculoskeletal: Normal range of motion.  Pulmonary:     Effort: Pulmonary effort is normal.  Abdominal:     Tenderness: There is abdominal tenderness in the suprapubic area. There is no right CVA tenderness or left CVA tenderness.  Genitourinary:    General: Normal vulva.     Pubic Area: No rash.      Labia:        Right: No rash, tenderness or lesion.        Left: No rash, tenderness or lesion.      Vagina: Normal. No vaginal discharge, erythema or tenderness.     Uterus: Absent. Tender.      Adnexa:        Right: No mass or tenderness.         Left: No mass or tenderness.    Musculoskeletal: Normal range of motion.  Skin:    General: Skin is warm and dry.  Neurological:     General: No focal deficit present.     Mental Status: She is alert and oriented to person, place, and time.     Cranial Nerves: No cranial nerve deficit.  Psychiatric:        Mood and Affect: Mood normal.        Behavior: Behavior normal.        Thought Content: Thought content normal.        Judgment: Judgment normal.     Results: Results for orders placed or performed in visit on 05/21/19 (from the past 24 hour(s))  POCT Urinalysis Dipstick     Status: Normal   Collection Time: 05/21/19  3:31 PM  Result Value Ref Range   Color, UA yellow    Clarity, UA clear    Glucose, UA Negative Negative   Bilirubin, UA  neg  Ketones, UA neg    Spec Grav, UA 1.025 1.010 - 1.025   Blood, UA large    pH, UA 5.0 5.0 - 8.0   Protein, UA Negative Negative   Urobilinogen, UA     Nitrite, UA neg    Leukocytes, UA Negative Negative   Appearance     Odor       Assessment/Plan: Acute cystitis with hematuria - Plan: nitrofurantoin, macrocrystal-monohydrate, (MACROBID) 100 MG capsule, Urine Culture-GYN, POCT Urinalysis Dipstick; Pos sx/ pos UA. Rx macrobid. Check C&S. Will f/u with results and mgmt if C&S neg.   Pelvic pain--Tender on exam. Treat for UTI. Will f/u if C&S neg. Is s/p TVHBSO.  Cervical cancer screening - Plan: [facility] P[facility]  Screening for HPV (human papillomavirus) - Plan: [facility] P[facility]  History of cervical dysplasia - Plan: [facility] P[facility]    Meds ordered this encounter  Medications  . nitrofurantoin, macrocrystal-monohydrate, (MACROBID) 100 MG capsule    Sig: Take 1 capsule (100 mg total) by mouth 2 (two) times daily for 7 days.    Dispense:  14 capsule    Refill:  0    Order Specific Question:   Supervising Provider    Answer:   Gae Dry [[id number]]      Return if symptoms worsen or fail to improve.  Nevaen Tredway B. Beyonce Sawatzky, PA-C 05/21/2019 3:35 PM

## 2019-05-23 LAB — CYTOLOGY - PAP
Comment: NEGATIVE
Diagnosis: NEGATIVE
High risk HPV: NEGATIVE

## 2019-05-23 LAB — URINE CULTURE

## 2019-05-23 NOTE — Progress Notes (Signed)
Pls let pt know C&S neg. Stop abx. Needs to f/u with [facility] urology again. Thx

## 2019-05-23 NOTE — Progress Notes (Signed)
Pt aware.

## 2019-06-05 ENCOUNTER — Ambulatory Visit: Payer: Managed Care, Other (non HMO) | Admitting: Obstetrics and Gynecology

## 2019-10-30 ENCOUNTER — Other Ambulatory Visit: Payer: Self-pay | Admitting: Internal Medicine

## 2019-10-30 DIAGNOSIS — R945 Abnormal results of liver function studies: Secondary | ICD-10-CM

## 2019-10-30 DIAGNOSIS — R7989 Other specified abnormal findings of blood chemistry: Secondary | ICD-10-CM

## 2019-11-04 ENCOUNTER — Ambulatory Visit
Admission: RE | Admit: 2019-11-04 | Discharge: 2019-11-04 | Disposition: A | Discharge status: Home / Self Care | Payer: Managed Care, Other (non HMO) | Source: Ambulatory Visit | Attending: Internal Medicine | Admitting: Internal Medicine

## 2019-11-04 ENCOUNTER — Other Ambulatory Visit: Payer: Self-pay

## 2019-11-04 DIAGNOSIS — R945 Abnormal results of liver function studies: Secondary | ICD-10-CM | POA: Diagnosis present

## 2019-11-04 DIAGNOSIS — R7989 Other specified abnormal findings of blood chemistry: Secondary | ICD-10-CM

## 2020-02-04 ENCOUNTER — Ambulatory Visit: Payer: Managed Care, Other (non HMO) | Admitting: Nurse Practitioner

## 2020-02-04 ENCOUNTER — Other Ambulatory Visit: Payer: Self-pay

## 2020-02-04 VITALS — BP 126/80 | HR 76 | Temp 97.4°F | Resp 15 | Ht 62.0 in | Wt 135.0 lb

## 2020-02-04 DIAGNOSIS — Z0000 Encounter for general adult medical examination without abnormal findings: Secondary | ICD-10-CM | POA: Diagnosis not present

## 2020-02-04 DIAGNOSIS — Z008 Encounter for other general examination: Secondary | ICD-10-CM | POA: Diagnosis not present

## 2020-02-04 NOTE — Progress Notes (Signed)
Subjective:     Patient ID: Darlene Yang, female   DOB: 05-02-1956, 64 y.o.   MRN: [id number]  HPI Darlene Yang is a 64 y.o. female employed by [employer] in the Department of [place]. She has been employed there for 22 years and enjoys her job. 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 Only sx was that she woke one morning and could not remember the night before or anything that morning. Cholesterol was severely elevated. She was hospitalized and made a full recovery. No recurrence since starting Lipitor, ASA and change in diet and exercise. Employee reports no problems today.   PCP: Dr. Marcello Fennel  PMH: Bell's Palsy, Cervical Dysplasia, Hypercholesterolemia, CVA Surgeries: BTL, LEEP procedure, vaginal hysterectomy, Retinal surgery  SH: denies use of tobacco, ETOH or drugs Immunizations: UTD, has had both doses of Covid vaccine  Meds: ASA, Lipitor, Zyrtec, Vit D, Multi Vit. Allergies: Prednisone = nausea  Diet/Exercise: no fried food since CVA, walks 2 miles 4 days a week and starting a bike ridding routine.   Review of Systems Employee denies any problems today    Objective: BP 126/80 (BP Location: Left Arm, Patient Position: Sitting, Cuff Size: Normal)   Pulse 76   Temp (!) 97.4 F (36.3 C) (Temporal)   Resp 15   Ht 5\' 2"  (1.575 m)   Wt 135 lb (61.2 kg)   SpO2 98%   BMI 24.69 kg/m     Physical Exam Vitals and nursing note reviewed.  HENT:     Head: Normocephalic and atraumatic.     Right Ear: External ear normal.     Left Ear: External ear normal.  Eyes:     Conjunctiva/sclera: Conjunctivae normal.     Comments: Hx eye surgery for torn retina.  Neck:     Vascular: No carotid bruit.  Cardiovascular:     Rate and Rhythm: Normal rate and regular rhythm.  Pulmonary:     Effort: Pulmonary effort is normal.  Musculoskeletal:        General: No swelling. Normal range of motion.     Cervical back: Normal range of motion.     Right lower  leg: No edema.     Left lower leg: No edema.  Skin:    General: Skin is warm and dry.  Neurological:     Mental Status: She is alert.     Sensory: Sensation is intact.     Motor: No weakness or tremor.     Coordination: Romberg sign negative.     Gait: Gait is intact.     Deep Tendon Reflexes:     Reflex Scores:      Bicep reflexes are 2+ on the right side and 2+ on the left side.      Brachioradialis reflexes are 2+ on the right side and 2+ on the left side.      Patellar reflexes are 2+ on the right side and 2+ on the left side.    Comments: Radial pulses 2+, grips are equal. Ambulatory with steady gait, stands on one foot without difficulty.   Psychiatric:        Attention and Perception: Attention normal.        Mood and Affect: Mood normal.        Speech: Speech normal.        Behavior: Behavior normal.        Thought Content: Thought content normal.  Cognition and Memory: Memory normal.        Assessment:    64 y.o. female here for biometric screening exam. Limited physical exam normal. (other than eye from previous surgery for torn retina).     Plan:   Lipid panel and glucose drawn. Results will be available in "My Chart". Discussed with the employee diet and exercise plan.  Employee given opportunity to ask questions. All questions answered and employee voices understanding. Employee to return in one year for biometric exam and f/u with PCP as needed. Return here as needed.

## 2020-02-04 NOTE — Patient Instructions (Signed)
Preventing High Cholesterol Cholesterol is a white, waxy substance similar to fat that the human body needs to help build cells. The liver makes all the cholesterol that a person's body needs. Having high cholesterol (hypercholesterolemia) increases a person's risk for heart disease and stroke. Extra (excess) cholesterol comes from the food the person eats. High cholesterol can often be prevented with diet and lifestyle changes. If you already have high cholesterol, you can control it with diet and lifestyle changes and with medicine. How can high cholesterol affect me? If you have high cholesterol, deposits (plaques) may build up on the walls of your arteries. The arteries are the blood vessels that carry blood away from your heart. Plaques make the arteries narrower and stiffer. This can limit or block blood flow and cause blood clots to form. Blood clots:  Are tiny balls of cells that form in your blood.  Can move to the heart or brain, causing a heart attack or stroke. Plaques in arteries greatly increase your risk for heart attack and stroke.Making diet and lifestyle changes can reduce your risk for these conditions that may threaten your life. What can increase my risk? This condition is more likely to develop in people who:  Eat foods that are high in saturated fat or cholesterol. Saturated fat is mostly found in: ? Foods that contain animal fat, such as red meat and some dairy products. ? Certain fatty foods made from plants, such as tropical oils.  Are overweight.  Are not getting enough exercise.  Have a family history of high cholesterol. What actions can I take to prevent this? Nutrition   Eat less saturated fat.  Avoid trans fats (partially hydrogenated oils). These are often found in margarine and in some baked goods, fried foods, and snacks bought in packages.  Avoid precooked or cured meat, such as sausages or meat loaves.  Avoid foods and drinks that have added  sugars.  Eat more fruits, vegetables, and whole grains.  Choose healthy sources of protein, such as fish, poultry, lean cuts of red meat, beans, peas, lentils, and nuts.  Choose healthy sources of fat, such as: ? Nuts. ? Vegetable oils, especially olive oil. ? Fish that have healthy fats (omega-3 fatty acids), such as mackerel or salmon. The items listed above may not be a complete list of recommended foods and beverages. Contact a dietitian for more information. Lifestyle  Lose weight if you are overweight. Losing 5-10 lb (2.3-4.5 kg) can help prevent or control high cholesterol. It can also lower your risk for diabetes and high blood pressure. Ask your health care provider to help you with a diet and exercise plan to lose weight safely.  Do not use any products that contain nicotine or tobacco, such as cigarettes, e-cigarettes, and chewing tobacco. If you need help quitting, ask your health care provider.  Limit your alcohol intake. ? Do not drink alcohol if:  Your health care provider tells you not to drink.  You are pregnant, may be pregnant, or are planning to become pregnant. ? If you drink alcohol:  Limit how much you use to:  0-1 drink a day for women.  0-2 drinks a day for men.  Be aware of how much alcohol is in your drink. In the [place] one drink equals one 12 oz bottle of beer (355 mL), one 5 oz glass of wine (148 mL), or one 1 oz glass of hard liquor (44 mL). Activity   Get enough exercise. Each week, do at  least 150 minutes of exercise that takes a medium level of effort (moderate-intensity exercise). ? This is exercise that:  Makes your heart beat faster and makes you breathe harder than usual.  Allows you to still be able to talk. ? You could exercise in short sessions several times a day or longer sessions a few times a week. For example, on 5 days each week, you could walk fast or ride your bike 3 times a day for 10 minutes each time.  Do exercises as told  by your health care provider. Medicines  In addition to diet and lifestyle changes, your health care provider may recommend medicines to help lower cholesterol. This may be a medicine to lower the amount of cholesterol your liver makes. You may need medicine if: ? Diet and lifestyle changes do not lower your cholesterol enough. ? You have high cholesterol and other risk factors for heart disease or stroke.  Take over-the-counter and prescription medicines only as told by your health care provider. General information  Manage your risk factors for high cholesterol. Talk with your health care provider about all your risk factors and how to lower your risk.  Manage other conditions that you have, such as diabetes or high blood pressure (hypertension).  Have blood tests to check your cholesterol levels at regular points in time as told by your health care provider.  Keep all follow-up visits as told by your health care provider. This is important. Where to find more information  American Heart Association: www.heart.org  National Heart, Lung, and Blood Institute: https://wilson-eaton.com/ Summary  High cholesterol increases your risk for heart disease and stroke. By keeping your cholesterol level low, you can reduce your risk for these conditions.  High cholesterol can often be prevented with diet and lifestyle changes.  Work with your health care provider to manage your risk factors, and have your blood tested regularly. This information is not intended to replace advice given to you by your health care provider. Make sure you discuss any questions you have with your health care provider. Document Revised: 10/11/2018 Document Reviewed: 02/26/2016 Elsevier Patient Education  [place] and Cholesterol Restricted Eating Plan Getting too much fat and cholesterol in your diet may cause health problems. Choosing the right foods helps keep your fat and cholesterol at normal levels.  This can keep you from getting certain diseases. Your doctor may recommend an eating plan that includes:  Total fat: ______% or less of total calories a day.  Saturated fat: ______% or less of total calories a day.  Cholesterol: less than _________mg a day.  Fiber: ______g a day. What are tips for following this plan? Meal planning  At meals, divide your plate into four equal parts: ? Fill one-half of your plate with vegetables and green salads. ? Fill one-fourth of your plate with whole grains. ? Fill one-fourth of your plate with low-fat (lean) protein foods.  Eat fish that is high in omega-3 fats at least two times a week. This includes mackerel, tuna, sardines, and salmon.  Eat foods that are high in fiber, such as whole grains, beans, apples, broccoli, carrots, peas, and barley. General tips   Work with your doctor to lose weight if you need to.  Avoid: ? Foods with added sugar. ? Fried foods. ? Foods with partially hydrogenated oils.  Limit alcohol intake to no more than 1 drink a day for nonpregnant women and 2 drinks a day for men. One drink equals 12  oz of beer, 5 oz of wine, or 1 oz of hard liquor. Reading food labels  Check food labels for: ? Trans fats. ? Partially hydrogenated oils. ? Saturated fat (g) in each serving. ? Cholesterol (mg) in each serving. ? Fiber (g) in each serving.  Choose foods with healthy fats, such as: ? Monounsaturated fats. ? Polyunsaturated fats. ? Omega-3 fats.  Choose grain products that have whole grains. Look for the word "whole" as the first word in the ingredient list. Cooking  Cook foods using low-fat methods. These include baking, boiling, grilling, and broiling.  Eat more home-cooked foods. Eat at restaurants and buffets less often.  Avoid cooking using saturated fats, such as butter, cream, palm oil, palm kernel oil, and coconut oil. Recommended foods  Fruits  All fresh, canned (in natural juice), or frozen  fruits. Vegetables  Fresh or frozen vegetables (raw, steamed, roasted, or grilled). Green salads. Grains  Whole grains, such as whole wheat or whole grain breads, crackers, cereals, and pasta. Unsweetened oatmeal, bulgur, barley, quinoa, or brown rice. Corn or whole wheat flour tortillas. Meats and other protein foods  Ground beef (85% or leaner), grass-fed beef, or beef trimmed of fat. Skinless chicken or [place]. Ground chicken or [place]. Pork trimmed of fat. All fish and seafood. Egg whites. Dried beans, peas, or lentils. Unsalted nuts or seeds. Unsalted canned beans. Nut butters without added sugar or oil. Dairy  Low-fat or nonfat dairy products, such as skim or 1% milk, 2% or reduced-fat cheeses, low-fat and fat-free ricotta or cottage cheese, or plain low-fat and nonfat yogurt. Fats and oils  Tub margarine without trans fats. Light or reduced-fat mayonnaise and salad dressings. Avocado. Olive, canola, sesame, or safflower oils. The items listed above may not be a complete list of foods and beverages you can eat. Contact a dietitian for more information. Foods to avoid Fruits  Canned fruit in heavy syrup. Fruit in cream or butter sauce. Fried fruit. Vegetables  Vegetables cooked in cheese, cream, or butter sauce. Fried vegetables. Grains  White bread. White pasta. White rice. Cornbread. Bagels, pastries, and croissants. Crackers and snack foods that contain trans fat and hydrogenated oils. Meats and other protein foods  Fatty cuts of meat. Ribs, chicken wings, bacon, sausage, bologna, salami, chitterlings, fatback, hot dogs, bratwurst, and packaged lunch meats. Liver and organ meats. Whole eggs and egg yolks. Chicken and [place] with skin. Fried meat. Dairy  Whole or 2% milk, cream, half-and-half, and cream cheese. Whole milk cheeses. Whole-fat or sweetened yogurt. Full-fat cheeses. Nondairy creamers and whipped toppings. Processed cheese, cheese spreads, and cheese  curds. Beverages  Alcohol. Sugar-sweetened drinks such as sodas, lemonade, and fruit drinks. Fats and oils  Butter, stick margarine, lard, shortening, ghee, or bacon fat. Coconut, palm kernel, and palm oils. Sweets and desserts  Corn syrup, sugars, honey, and molasses. Candy. Jam and jelly. Syrup. Sweetened cereals. Cookies, pies, cakes, donuts, muffins, and ice cream. The items listed above may not be a complete list of foods and beverages you should avoid. Contact a dietitian for more information. Summary  Choosing the right foods helps keep your fat and cholesterol at normal levels. This can keep you from getting certain diseases.  At meals, fill one-half of your plate with vegetables and green salads.  Eat high-fiber foods, like whole grains, beans, apples, carrots, peas, and barley.  Limit added sugar, saturated fats, alcohol, and fried foods. This information is not intended to replace advice given to you by your health care  provider. Make sure you discuss any questions you have with your health care provider. Document Revised: 02/20/2018 Document Reviewed: 03/06/2017 Elsevier Patient Education  2020 [employer].  Healthy Eating Following a healthy eating pattern may help you to achieve and maintain a healthy body weight, reduce the risk of chronic disease, and live a long and productive life. It is important to follow a healthy eating pattern at an appropriate calorie level for your body. Your nutritional needs should be met primarily through food by choosing a variety of nutrient-rich foods. What are tips for following this plan? Reading food labels  Read labels and choose the following: ? Reduced or low sodium. ? Juices with 100% fruit juice. ? Foods with low saturated fats and high polyunsaturated and monounsaturated fats. ? Foods with whole grains, such as whole wheat, cracked wheat, brown rice, and wild rice. ? Whole grains that are fortified with folic acid. This is  recommended for women who are pregnant or who want to become pregnant.  Read labels and avoid the following: ? Foods with a lot of added sugars. These include foods that contain brown sugar, corn sweetener, corn syrup, dextrose, fructose, glucose, high-fructose corn syrup, honey, invert sugar, lactose, malt syrup, maltose, molasses, raw sugar, sucrose, trehalose, or turbinado sugar.  Do not eat more than the following amounts of added sugar per day:  6 teaspoons (25 g) for women.  9 teaspoons (38 g) for men. ? Foods that contain processed or refined starches and grains. ? Refined grain products, such as white flour, degermed cornmeal, white bread, and white rice. Shopping  Choose nutrient-rich snacks, such as vegetables, whole fruits, and nuts. Avoid high-calorie and high-sugar snacks, such as potato chips, fruit snacks, and candy.  Use oil-based dressings and spreads on foods instead of solid fats such as butter, stick margarine, or cream cheese.  Limit pre-made sauces, mixes, and "instant" products such as flavored rice, instant noodles, and ready-made pasta.  Try more plant-protein sources, such as tofu, tempeh, black beans, edamame, lentils, nuts, and seeds.  Explore eating plans such as the Mediterranean diet or vegetarian diet. Cooking  Use oil to saut or stir-fry foods instead of solid fats such as butter, stick margarine, or lard.  Try baking, boiling, grilling, or broiling instead of frying.  Remove the fatty part of meats before cooking.  Steam vegetables in water or broth. Meal planning   At meals, imagine dividing your plate into fourths: ? One-half of your plate is fruits and vegetables. ? One-fourth of your plate is whole grains. ? One-fourth of your plate is protein, especially lean meats, poultry, eggs, tofu, beans, or nuts.  Include low-fat dairy as part of your daily diet. Lifestyle  Choose healthy options in all settings, including home, work, school,  restaurants, or stores.  Prepare your food safely: ? Wash your hands after handling raw meats. ? Keep food preparation surfaces clean by regularly washing with hot, soapy water. ? Keep raw meats separate from ready-to-eat foods, such as fruits and vegetables. ? Cook seafood, meat, poultry, and eggs to the recommended internal temperature. ? Store foods at safe temperatures. In general:  Keep cold foods at 25F (4.4C) or below.  Keep hot foods at 125F (60C) or above.  Keep your freezer at [facility] (-17.8C) or below.  Foods are no longer safe to eat when they have been between the temperatures of 40-125F (4.4-60C) for more than 2 hours. What foods should I eat? Fruits Aim to eat 2 cup-equivalents of  fresh, canned (in natural juice), or frozen fruits each day. Examples of 1 cup-equivalent of fruit include 1 small apple, 8 large strawberries, 1 cup canned fruit,  cup dried fruit, or 1 cup 100% juice. Vegetables Aim to eat 2-3 cup-equivalents of fresh and frozen vegetables each day, including different varieties and colors. Examples of 1 cup-equivalent of vegetables include 2 medium carrots, 2 cups raw, leafy greens, 1 cup chopped vegetable (raw or cooked), or 1 medium baked potato. Grains Aim to eat 6 ounce-equivalents of whole grains each day. Examples of 1 ounce-equivalent of grains include 1 slice of bread, 1 cup ready-to-eat cereal, 3 cups popcorn, or  cup cooked rice, pasta, or cereal. Meats and other proteins Aim to eat 5-6 ounce-equivalents of protein each day. Examples of 1 ounce-equivalent of protein include 1 egg, 1/2 cup nuts or seeds, or 1 tablespoon (16 g) peanut butter. A cut of meat or fish that is the size of a deck of cards is about 3-4 ounce-equivalents.  Of the protein you eat each week, try to have at least 8 ounces come from seafood. This includes salmon, trout, herring, and anchovies. Dairy Aim to eat 3 cup-equivalents of fat-free or low-fat dairy each day. Examples  of 1 cup-equivalent of dairy include 1 cup (240 mL) milk, 8 ounces (250 g) yogurt, 1 ounces (44 g) natural cheese, or 1 cup (240 mL) fortified soy milk. Fats and oils  Aim for about 5 teaspoons (21 g) per day. Choose monounsaturated fats, such as canola and olive oils, avocados, peanut butter, and most nuts, or polyunsaturated fats, such as sunflower, corn, and soybean oils, walnuts, pine nuts, sesame seeds, sunflower seeds, and flaxseed. Beverages  Aim for six 8-oz glasses of water per day. Limit coffee to three to five 8-oz cups per day.  Limit caffeinated beverages that have added calories, such as soda and energy drinks.  Limit alcohol intake to no more than 1 drink a day for nonpregnant women and 2 drinks a day for men. One drink equals 12 oz of beer (355 mL), 5 oz of wine (148 mL), or 1 oz of hard liquor (44 mL). Seasoning and other foods  Avoid adding excess amounts of salt to your foods. Try flavoring foods with herbs and spices instead of salt.  Avoid adding sugar to foods.  Try using oil-based dressings, sauces, and spreads instead of solid fats. This information is based on general [place] nutrition guidelines. For more information, visit DisposableNylon.be. Exact amounts may vary based on your nutrition needs. Summary  A healthy eating plan may help you to maintain a healthy weight, reduce the risk of chronic diseases, and stay active throughout your life.  Plan your meals. Make sure you eat the right portions of a variety of nutrient-rich foods.  Try baking, boiling, grilling, or broiling instead of frying.  Choose healthy options in all settings, including home, work, school, restaurants, or stores. This information is not intended to replace advice given to you by your health care provider. Make sure you discuss any questions you have with your health care provider. Document Revised: 10/01/2017 Document Reviewed: 10/01/2017 Elsevier Patient Education  2020 [employer].  Exercise Information for Aging Adults Staying physically active is important as you age. The four types of exercises that are best for older adults are endurance, strength, balance, and flexibility. Contact your health care provider before you start any exercise routine. Ask your health care provider what activities are safe for you. What are the risks?  Risks associated with exercising include:  Overdoing it. This may lead to sore muscles or fatigue.  Falls.  Injuries.  Dehydration. How to do these exercises Endurance exercises Endurance (aerobic) exercises raise your breathing rate and heart rate. Increasing your endurance helps you to do everyday tasks and stay healthy. By improving the health of your body system that includes your heart, lungs, and blood vessels (circulatory system), you may also delay or prevent diseases such as heart disease, diabetes, and bone loss (osteoporosis). Types of endurance exercises include:  Sports.  Indoor activities, such as using gym equipment, doing water aerobics, or dancing.  Outdoor activities, such as biking or jogging.  Tasks around the house, such as gardening, yard work, and heavy household chores like cleaning.  Walking, such as hiking or walking around your neighborhood. When doing endurance exercises, make sure you:  Are aware of your surroundings.  Use safety equipment as directed.  Dress in layers when exercising outdoors.  Drink plenty of water to stay well hydrated. Build up endurance slowly. Start with 10 minutes at a time, and gradually build up to doing 30 minutes at a time. Unless your health care provider gave you different instructions, aim to exercise for a total of 150 minutes a week. Spread out that time so you are working on endurance on 3 or more days a week. Strength exercises Lifting, pulling, or pushing weights helps to strengthen muscles. Having stronger muscles makes it easier to do everyday activities,  such as getting up from a chair, climbing stairs, carrying groceries, and playing with grandchildren. Strength exercises include arm and leg exercises that may be done:  With weights.  Without weights (using your own body weight).  With a resistance band. When doing strength exercises:  Move smoothly and steadily. Do not suddenly thrust or jerk the weights, the resistance band, or your body.  Start with no weights or with light weights, and gradually add more weight over time. Eventually, aim to use weights that are hard or very hard for you to lift. This means that you are able to do 8 repetitions with the weight, and the last few repetitions are very challenging.  Lift or push weights into position for 3 seconds, hold the position for 1 second, and then take 3 seconds to return to your starting position.  Breathe out (exhale) during difficult movements, like lifting or pushing weights. Breathe in (inhale) to relax your muscles before the next repetition.  Consider alternating arms or legs, especially when you first start strength exercises.  Expect some slight muscle soreness after each session. Do strength exercises on 2 or more days a week, for 30 minutes at a time. Avoid exercising the same muscle groups two days in a row. For example, if you work on your leg muscles one day, work on your arm muscles the next day. When you can do two sets of 10-15 repetitions with a certain weight, increase the amount of weight. Balance Balance exercises can help to prevent falls. Balance exercises include:  Standing on one foot.  Heel-to-toe walk.  Balance walk.  Tai chi. Make sure you have something sturdy to hold onto while doing balance exercises, such as a sturdy chair. As your balance improves, challenge yourself by holding onto the chair with one hand instead of two, and then with no hands. Trying exercises with your eyes closed also challenges your balance, but be sure to have a sturdy  surface (like a countertop) close by in case you  need it. Do balance exercises as often as you want, or as often as directed by your health care provider. Strength exercises for the lower body also help to improve balance. Flexibility  Flexibility exercises improve how far you can bend, straighten, move, or rotate parts of your body (range of motion). These exercises also help you to do everyday activities such as getting dressed or reaching for objects. Flexibility exercises include stretching different parts of the body, and they may be done in a standing or seated position or on the floor. When stretching, make sure you:  Keep a slight bend in your arms and legs. Avoid completely straightening ("locking") your joints.  Do not stretch so far that you feel pain. You should feel a mild stretching feeling. You may try stretching farther as you become more flexible over time.  Relax and breathe between stretches.  Hold onto something sturdy for balance as needed. Hold each stretch for 10-30 seconds. Repeat each stretch 3-5 times. General safety tips  Exercise in well-lit areas.  Do not hold your breath during exercises or stretches.  Warm up before exercising, and cool down after exercising. This can help prevent injury.  Drink plenty of water during exercise or any activity that makes you sweat.  Use smooth, steady movements. Do not use sudden, jerking movements, especially when lifting weights or doing flexibility exercises.  If you are not sure if an exercise is safe for you, or you are not sure how to do an exercise, talk with your health care provider. This is especially important if you have had surgery on muscles, bones, or joints (orthopedic surgery). Where to find more information You can find more information about exercise for older adults from:  Your local health department, fitness center, or community center. These facilities may have programs for aging adults.  [employer] on Aging: http://kim-miller.com/  National Council on Aging: www.ncoa.org Summary  Staying physically active is important as you age.  Make sure to contact your health care provider before you start any exercise routine. Ask your health care provider what activities are safe for you.  Doing endurance, strength, balance, and flexibility exercises can help to delay or prevent certain diseases, such as heart disease, diabetes, and bone loss (osteoporosis). This information is not intended to replace advice given to you by your health care provider. Make sure you discuss any questions you have with your health care provider. Document Revised: 04/11/2018 Document Reviewed: 11/08/2016 Elsevier Patient Education  [place].

## 2020-02-05 LAB — LIPID PANEL
Chol/HDL Ratio: 2.5 ratio (ref 0.0–4.4)
Cholesterol, Total: 140 mg/dL (ref 100–199)
HDL: 55 mg/dL (ref >39)
LDL Chol Calc (NIH): 67 mg/dL (ref 0–99)
Triglycerides: 99 mg/dL (ref 0–149)
VLDL Cholesterol Cal: 18 mg/dL (ref 5–40)

## 2020-02-05 LAB — GLUCOSE, RANDOM: Glucose: 90 mg/dL (ref 65–99)

## 2020-04-29 ENCOUNTER — Other Ambulatory Visit: Payer: Self-pay | Admitting: Internal Medicine

## 2020-04-29 DIAGNOSIS — Z1231 Encounter for screening mammogram for malignant neoplasm of breast: Secondary | ICD-10-CM

## 2020-06-09 ENCOUNTER — Ambulatory Visit
Admission: RE | Admit: 2020-06-09 | Discharge: 2020-06-09 | Disposition: A | Discharge status: Home / Self Care | Payer: Managed Care, Other (non HMO) | Source: Ambulatory Visit | Attending: Internal Medicine | Admitting: Internal Medicine

## 2020-06-09 ENCOUNTER — Other Ambulatory Visit: Payer: Self-pay

## 2020-06-09 DIAGNOSIS — Z1231 Encounter for screening mammogram for malignant neoplasm of breast: Secondary | ICD-10-CM | POA: Insufficient documentation

## 2021-04-26 ENCOUNTER — Other Ambulatory Visit: Payer: Self-pay | Admitting: Internal Medicine

## 2021-04-26 DIAGNOSIS — Z1231 Encounter for screening mammogram for malignant neoplasm of breast: Secondary | ICD-10-CM

## 2021-06-13 ENCOUNTER — Ambulatory Visit
Admission: RE | Admit: 2021-06-13 | Discharge: 2021-06-13 | Disposition: A | Discharge status: Home / Self Care | Payer: Managed Care, Other (non HMO) | Source: Ambulatory Visit | Attending: Internal Medicine | Admitting: Internal Medicine

## 2021-06-13 ENCOUNTER — Other Ambulatory Visit: Payer: Self-pay

## 2021-06-13 DIAGNOSIS — Z1231 Encounter for screening mammogram for malignant neoplasm of breast: Secondary | ICD-10-CM | POA: Insufficient documentation

## 2021-09-22 IMAGING — MG MM DIGITAL SCREENING BILAT W/ TOMO W/ CAD
8 series · 9 of 24 positions shown · non-contrast
Comparison: Previous exam(s).

CLINICAL DATA: Screening.

EXAM:
DIGITAL SCREENING BILATERAL MAMMOGRAM WITH TOMO AND CAD

[R MLO synth-2D]
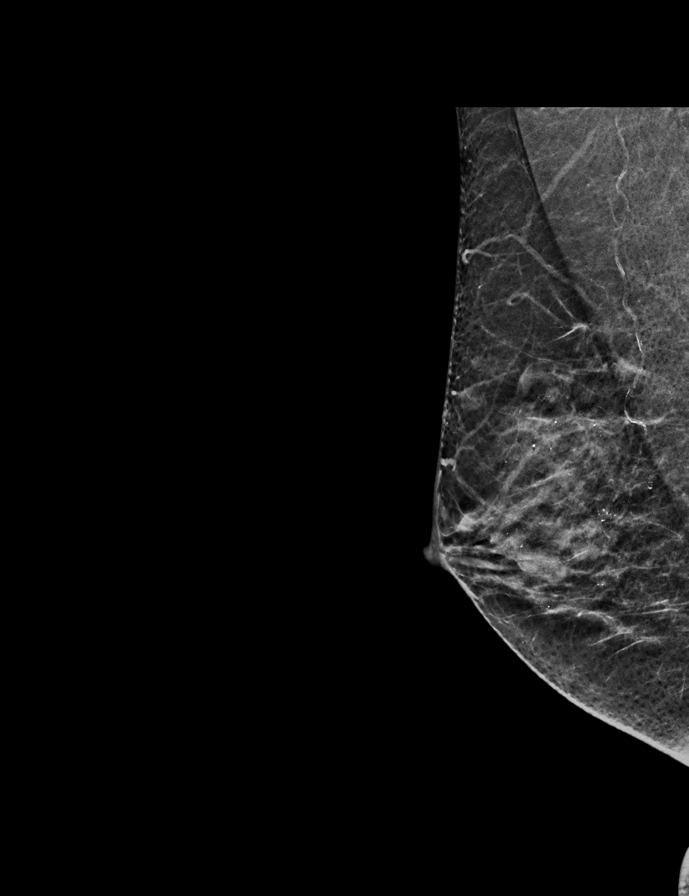

[L MLO synth-2D]
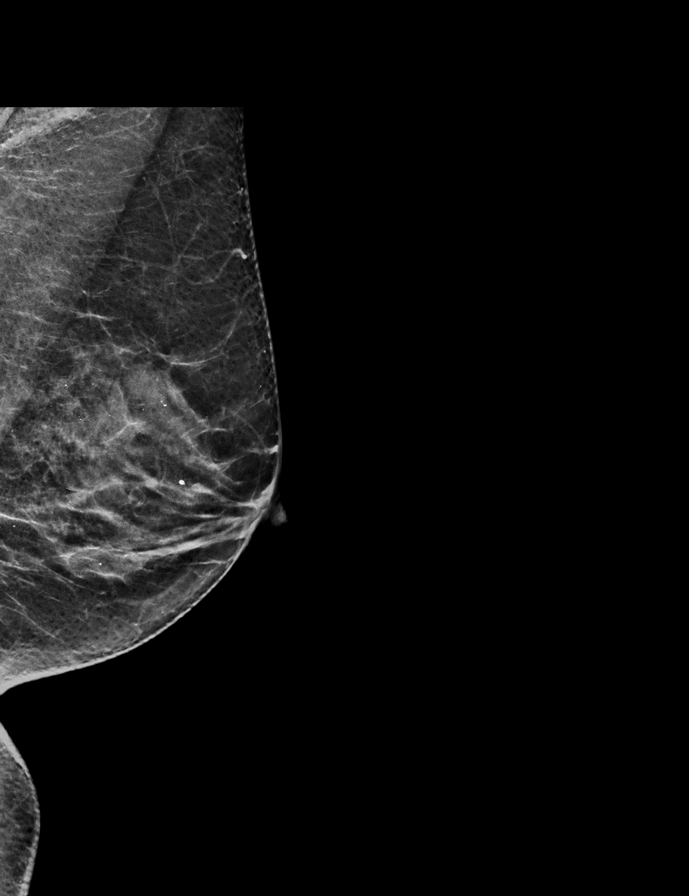

[R CC synth-2D]
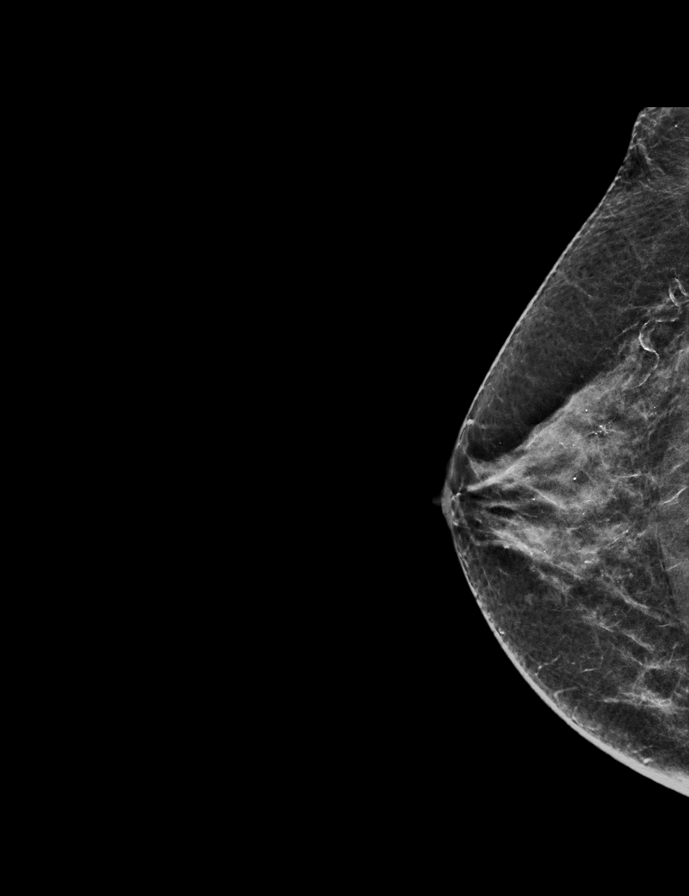

[L CC synth-2D]
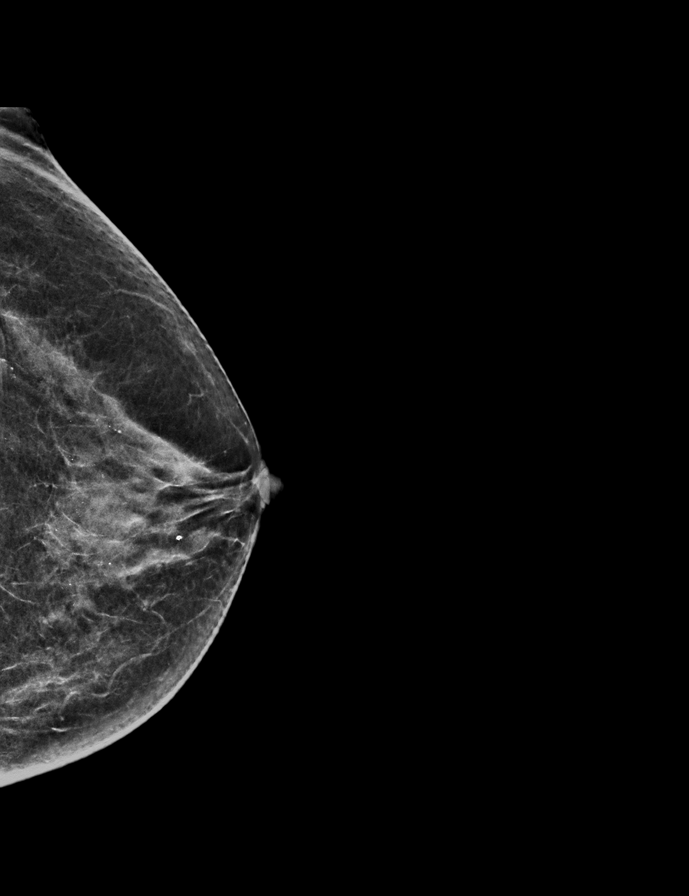

[R CC tomo · 2 of 52 frames shown]
[frame 17/52]
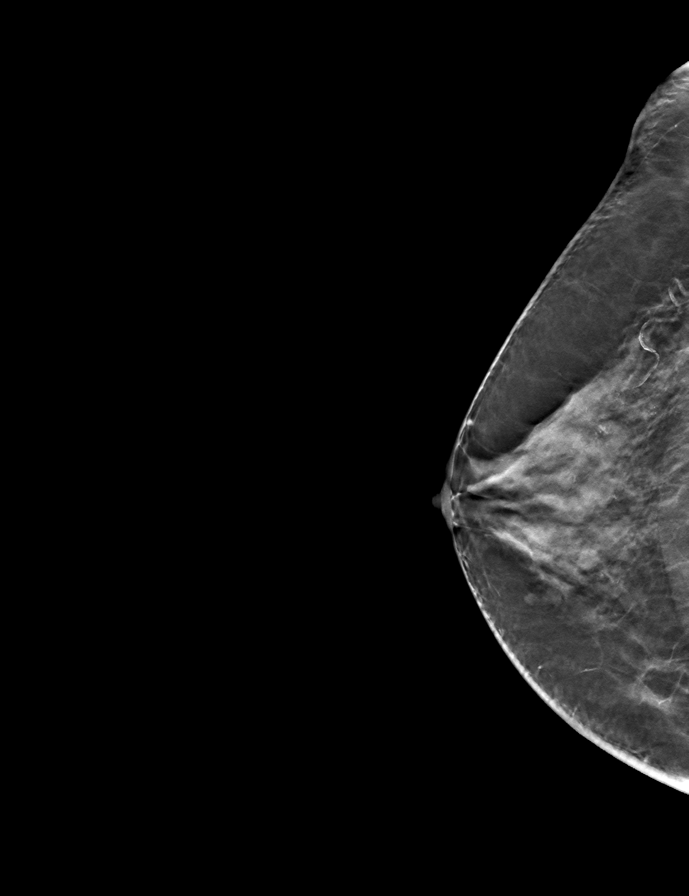
[frame 27/52]
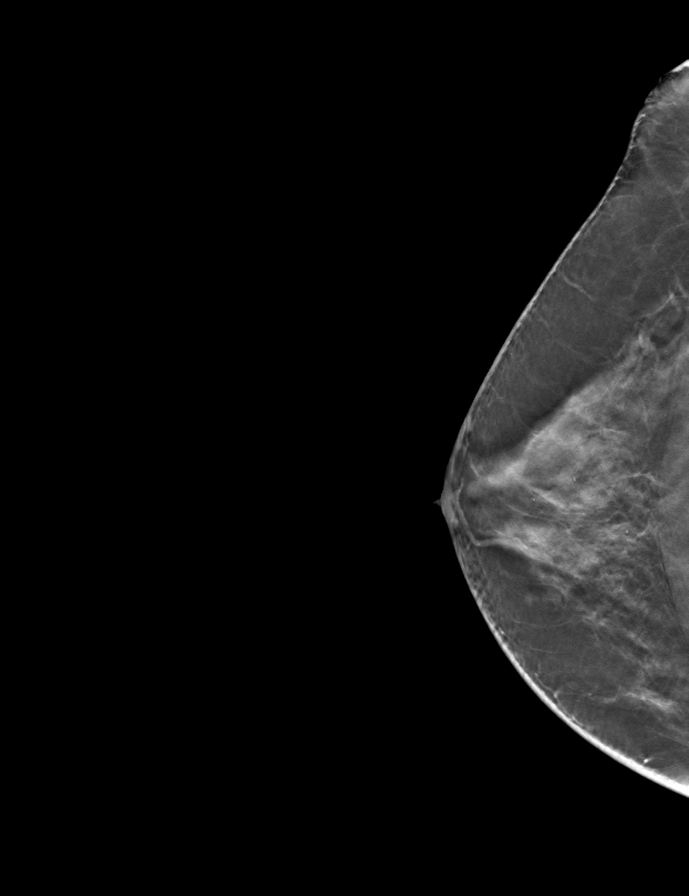

[L MLO tomo · tomo slice 28/55.0]
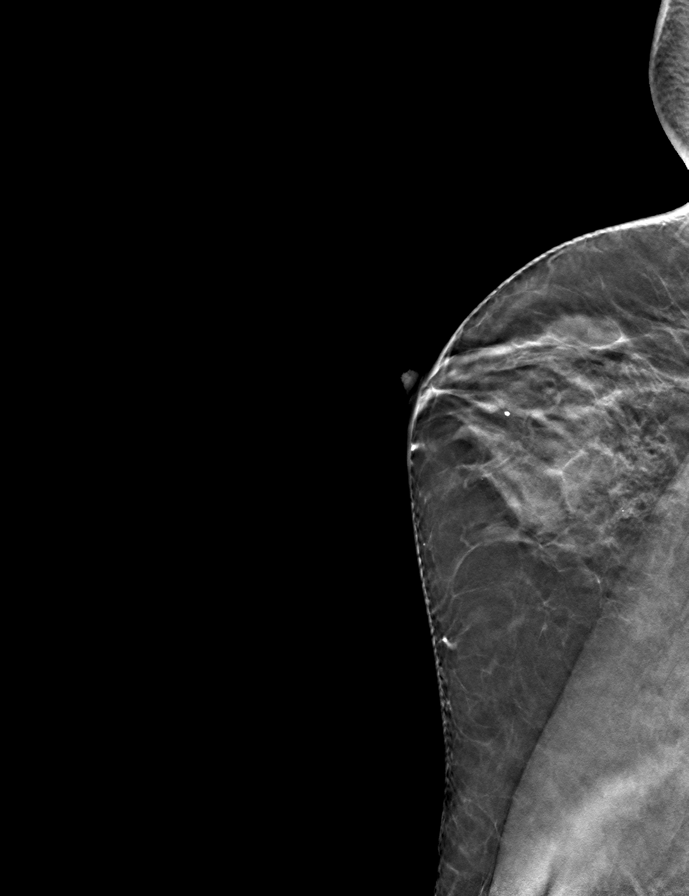

[R MLO tomo · tomo slice 23/46.0]
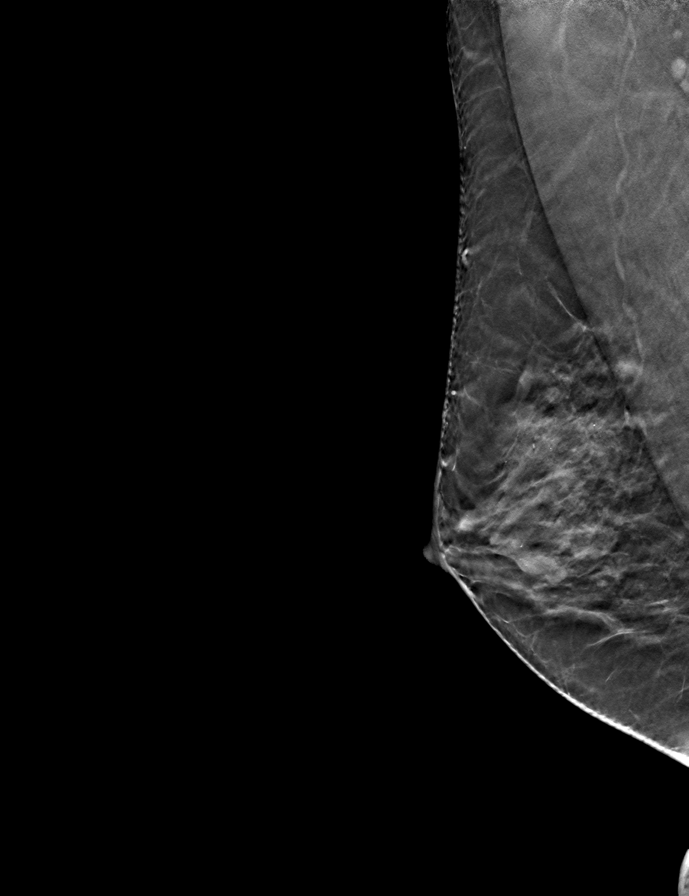

[L CC tomo · tomo slice 28/55.0]
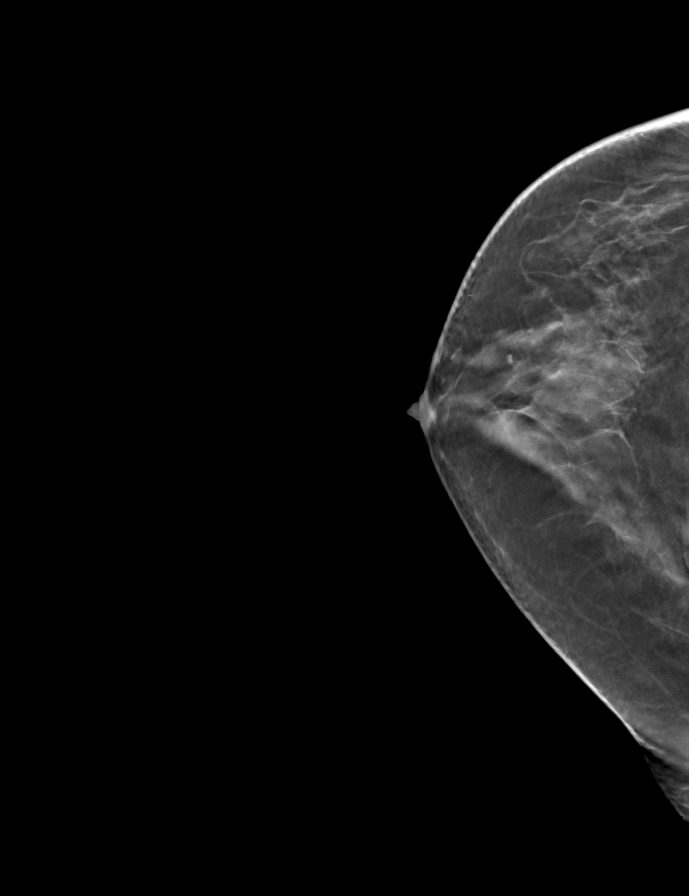

[9 of 24 positions shown; findings below may reference images not displayed]

ACR Breast Density Category c: The breast tissue is heterogeneously
dense, which may obscure small masses.
FINDINGS: There are no findings suspicious for malignancy. Images were
processed with CAD.
IMPRESSION: No mammographic evidence of malignancy. A result letter of this
screening mammogram will be mailed directly to the patient.

RECOMMENDATION:
Screening mammogram in one year. (Code:[id number])

BI-RADS CATEGORY  1: Negative.

## 2022-05-01 ENCOUNTER — Other Ambulatory Visit: Payer: Self-pay | Admitting: Internal Medicine

## 2022-05-01 DIAGNOSIS — Z1231 Encounter for screening mammogram for malignant neoplasm of breast: Secondary | ICD-10-CM

## 2022-05-17 ENCOUNTER — Ambulatory Visit: Payer: Managed Care, Other (non HMO) | Admitting: Anesthesiology

## 2022-05-17 ENCOUNTER — Ambulatory Visit
Admission: RE | Admit: 2022-05-17 | Discharge: 2022-05-17 | Disposition: A | Discharge status: Home / Self Care | Payer: Managed Care, Other (non HMO) | Attending: Internal Medicine | Admitting: Internal Medicine

## 2022-05-17 ENCOUNTER — Encounter: Payer: Self-pay | Admitting: Internal Medicine

## 2022-05-17 ENCOUNTER — Encounter
Admission: RE | Disposition: A | Discharge status: Home / Self Care | Payer: Self-pay | Source: Home / Self Care | Attending: Internal Medicine

## 2022-05-17 DIAGNOSIS — K222 Esophageal obstruction: Secondary | ICD-10-CM | POA: Diagnosis not present

## 2022-05-17 DIAGNOSIS — K6289 Other specified diseases of anus and rectum: Secondary | ICD-10-CM | POA: Insufficient documentation

## 2022-05-17 DIAGNOSIS — K259 Gastric ulcer, unspecified as acute or chronic, without hemorrhage or perforation: Secondary | ICD-10-CM | POA: Insufficient documentation

## 2022-05-17 DIAGNOSIS — R1310 Dysphagia, unspecified: Secondary | ICD-10-CM | POA: Diagnosis present

## 2022-05-17 DIAGNOSIS — E785 Hyperlipidemia, unspecified: Secondary | ICD-10-CM | POA: Insufficient documentation

## 2022-05-17 DIAGNOSIS — K640 First degree hemorrhoids: Secondary | ICD-10-CM | POA: Diagnosis not present

## 2022-05-17 DIAGNOSIS — Z8673 Personal history of transient ischemic attack (TIA), and cerebral infarction without residual deficits: Secondary | ICD-10-CM | POA: Insufficient documentation

## 2022-05-17 DIAGNOSIS — K2100 Gastro-esophageal reflux disease with esophagitis, without bleeding: Secondary | ICD-10-CM | POA: Insufficient documentation

## 2022-05-17 DIAGNOSIS — Z1211 Encounter for screening for malignant neoplasm of colon: Secondary | ICD-10-CM | POA: Insufficient documentation

## 2022-05-17 HISTORY — PX: COLONOSCOPY WITH PROPOFOL: SHX5780

## 2022-05-17 HISTORY — PX: ESOPHAGOGASTRODUODENOSCOPY (EGD) WITH PROPOFOL: SHX5813

## 2022-05-17 SURGERY — COLONOSCOPY WITH PROPOFOL
Anesthesia: General

## 2022-05-17 MED ORDER — PROPOFOL 500 MG/50ML IV EMUL
INTRAVENOUS | Status: DC | PRN
  Administered 2022-05-17: 100 ug/kg/min via INTRAVENOUS

## 2022-05-17 MED ORDER — PHENYLEPHRINE HCL-NACL 20-0.9 MG/250ML-% IV SOLN
INTRAVENOUS | Status: AC
  Filled 2022-05-17: qty 250

## 2022-05-17 MED ORDER — MIDAZOLAM HCL 2 MG/2ML IJ SOLN
INTRAMUSCULAR | Status: DC | PRN
  Administered 2022-05-17 (×2): 1 mg via INTRAVENOUS

## 2022-05-17 MED ORDER — PROPOFOL 10 MG/ML IV BOLUS
INTRAVENOUS | Status: DC | PRN
  Administered 2022-05-17 (×2): 50 mg via INTRAVENOUS

## 2022-05-17 MED ORDER — SODIUM CHLORIDE 0.9 % IV SOLN: INTRAVENOUS | Status: DC

## 2022-05-17 MED ORDER — FENTANYL CITRATE (PF) 100 MCG/2ML IJ SOLN
INTRAMUSCULAR | Status: AC
  Filled 2022-05-17: qty 2

## 2022-05-17 MED ORDER — MIDAZOLAM HCL 2 MG/2ML IJ SOLN
INTRAMUSCULAR | Status: AC
  Filled 2022-05-17: qty 2

## 2022-05-17 NOTE — Interval H&P Note (Signed)
History and Physical Interval Note:  05/17/2022 12:25 PM  Darlene Yang  has presented today for surgery, with the diagnosis of R13.10  - Dysphagia, unspecified type Z12.11  - Colon cancer screening.  The various methods of treatment have been discussed with the patient and family. After consideration of risks, benefits and other options for treatment, the patient has consented to  Procedure(s): COLONOSCOPY WITH PROPOFOL (N/A) ESOPHAGOGASTRODUODENOSCOPY (EGD) WITH PROPOFOL (N/A) as a surgical intervention.  The patient's history has been reviewed, patient examined, no change in status, stable for surgery.  I have reviewed the patient's chart and labs.  Questions were answered to the patient's satisfaction.     [place], [place]

## 2022-05-17 NOTE — Op Note (Signed)
[facility] Gastroenterology Patient Name: Darlene Yang Procedure Date: 05/17/2022 1:23 PM MRN: [id number] Account #: [id number] Date of Birth: 10-May-1956 Admit Type: Outpatient Age: 66 Room: [facility] ENDO ROOM 2 Gender: Female Note Status: Finalized Instrument Name: Upper Endoscope [id number] Procedure:             Upper GI endoscopy Indications:           Dysphagia Providers:             Royce Macadamia K. Adaja Wander MD, MD Medicines:             Propofol per Anesthesia Complications:         No immediate complications. Procedure:             Pre-Anesthesia Assessment:                        - The risks and benefits of the procedure and the                         sedation options and risks were discussed with the                         patient. All questions were answered and informed                         consent was obtained.                        - Patient identification and proposed procedure were                         verified prior to the procedure by the nurse. The                         procedure was verified in the procedure room.                        - ASA Grade Assessment: III - A patient with severe                         systemic disease.                        - After reviewing the risks and benefits, the patient                         was deemed in satisfactory condition to undergo the                         procedure.                        After obtaining informed consent, the endoscope was                         passed under direct vision. Throughout the procedure,                         the patient's blood pressure, pulse, and oxygen  saturations were monitored continuously. The Endoscope                         was introduced through the mouth, and advanced to the                         third part of duodenum. The upper GI endoscopy was                         accomplished without difficulty. The patient tolerated                          the procedure well. Findings:      LA Grade A (one or more mucosal breaks less than 5 mm, not extending       between tops of 2 mucosal folds) esophagitis with no bleeding was found       at the gastroesophageal junction.      One benign-appearing, intrinsic mild stenosis was found at the       gastroesophageal junction. This stenosis measured 1.6 cm (inner       diameter) x less than one cm (in length). The stenosis was traversed.       The scope was withdrawn. Dilation was performed with a Maloney dilator       with no resistance at 54 Fr.      Patchy mild inflammation characterized by erosions was found in the       gastric antrum. Biopsies were taken with a cold forceps for Helicobacter       pylori testing.      Two non-bleeding superficial gastric ulcers with no stigmata of bleeding       were found in the gastric antrum. The largest lesion was 6 mm in largest       dimension.      The cardia and gastric fundus were normal on retroflexion.      The examined duodenum was normal.      The exam was otherwise without abnormality. Impression:            - LA Grade A reflux esophagitis with no bleeding.                        - Benign-appearing esophageal stenosis. Dilated.                        - Gastritis. Biopsied.                        - Non-bleeding gastric ulcers with no stigmata of                         bleeding.                        - Normal examined duodenum.                        - The examination was otherwise normal. Recommendation:        - Await pathology results.                        - Use Protonix (pantoprazole) 40 mg PO daily.                        -  No aspirin, ibuprofen, naproxen, or other                         non-steroidal anti-inflammatory drugs.                        - Proceed with colonoscopy Procedure Code(s):     --- Professional ---                        [id number], Esophagogastroduodenoscopy, flexible,                          transoral; with biopsy, single or multiple                        43450, Dilation of esophagus, by unguided sound or                         bougie, single or multiple passes Diagnosis Code(s):     --- Professional ---                        R13.10, Dysphagia, unspecified                        K25.9, Gastric ulcer, unspecified as acute or chronic,                         without hemorrhage or perforation                        K29.70, Gastritis, unspecified, without bleeding                        K22.2, Esophageal obstruction                        K21.00, Gastro-esophageal reflux disease with                         esophagitis, without bleeding CPT copyright 2022 American Medical Association. All rights reserved. The codes documented in this report are preliminary and upon coder review may  be revised to meet current compliance requirements. Stanton Kidney MD, MD 05/17/2022 1:41:36 PM This report has been signed electronically. Number of Addenda: 0 Note Initiated On: 05/17/2022 1:23 PM Total Procedure Duration: 0 hours 4 minutes 55 seconds  Estimated Blood Loss:  Estimated blood loss: none.      [facility]

## 2022-05-17 NOTE — Anesthesia Preprocedure Evaluation (Addendum)
Anesthesia Evaluation  Patient identified by MRN, date of birth, ID band Patient awake    Reviewed: Allergy & Precautions, NPO status , Patient's Chart, lab work & pertinent test results  Airway Mallampati: III  TM Distance: >3 FB Neck ROM: full    Dental no notable dental hx.    Pulmonary neg pulmonary ROS   Pulmonary exam normal        Cardiovascular Exercise Tolerance: Good negative cardio ROS Normal cardiovascular exam     Neuro/Psych CVA (2020), No Residual Symptoms  negative psych ROS   GI/Hepatic negative GI ROS, Neg liver ROS,,,  Endo/Other  negative endocrine ROS    Renal/GU negative Renal ROS  negative genitourinary   Musculoskeletal   Abdominal Normal abdominal exam  (+)   Peds  Hematology negative hematology ROS (+)   Anesthesia Other Findings Past Medical History: No date: Abnormal Pap smear of cervix     Comment:  s/p LEEP No date: Bell's palsy     Comment:  with right face affect 1999: Cervical dysplasia No date: Hyperlipemia 09/2018: Stroke (HCC) No date: UTI (urinary tract infection)  Past Surgical History: 1999: BREAST BIOPSY; Left     Comment:  benign needle bx per pt 1999: LEEP No date: torn retina 1999: TOTAL VAGINAL HYSTERECTOMY     Comment:  TVHBSO due to cysts/CPP at [facility] No date: TUBAL LIGATION     Reproductive/Obstetrics negative OB ROS                             Anesthesia Physical Anesthesia Plan  ASA: 3  Anesthesia Plan: General   Post-op Pain Management:    Induction: Intravenous  PONV Risk Score and Plan: Propofol infusion and TIVA  Airway Management Planned: Natural Airway  Additional Equipment:   Intra-op Plan:   Post-operative Plan:   Informed Consent: I have reviewed the patients History and Physical, chart, labs and discussed the procedure including the risks, benefits and alternatives for the proposed anesthesia with the  patient or authorized representative who has indicated his/her understanding and acceptance.     Dental Advisory Given  Plan Discussed with: Anesthesiologist, CRNA and Surgeon  Anesthesia Plan Comments:         Anesthesia Quick Evaluation

## 2022-05-17 NOTE — Transfer of Care (Signed)
Immediate Anesthesia Transfer of Care Note  Patient: Darlene Yang  Procedure(s) Performed: COLONOSCOPY WITH PROPOFOL ESOPHAGOGASTRODUODENOSCOPY (EGD) WITH PROPOFOL  Patient Location: PACU  Anesthesia Type:General  Level of Consciousness: awake, alert , and oriented  Airway & Oxygen Therapy: Patient Spontanous Breathing and Patient connected to nasal cannula oxygen  Post-op Assessment: Report given to RN, Post -op Vital signs reviewed and stable, and Patient moving all extremities  Post vital signs: Reviewed and stable  Last Vitals:  Vitals Value Taken Time  BP 123/71 05/17/22 1401  Temp 36.1 C 05/17/22 1401  Pulse 97 05/17/22 1402  Resp 21 05/17/22 1402  SpO2 100 % 05/17/22 1402  Vitals shown include unvalidated device data.  Last Pain:  Vitals:   05/17/22 1401  TempSrc: Temporal  PainSc:          Complications: No notable events documented.

## 2022-05-17 NOTE — Op Note (Signed)
[facility] Patient Name: Darlene PughDebbie Yang Procedure Date: 05/17/2022 1:23 PM MRN: [id number] Account #: [id number] Date of Birth: [birth year] Admit Type: Outpatient Age: [age] Room: [facility] ENDO ROOM 2 Gender: Female Note Status: Finalized Instrument Name: Prentice DockerColonoscope [id number] Procedure:             Colonoscopy Indications:           Screening for colorectal malignant neoplasm Providers:             Darlene Macadamiaeodoro K. Veasna Santibanez MD, MD Medicines:             Propofol per Anesthesia Complications:         No immediate complications. Procedure:             Pre-Anesthesia Assessment:                        - The risks and benefits of the procedure and the                         sedation options and risks were discussed with the                         patient. All questions were answered and informed                         consent was obtained.                        - Patient identification and proposed procedure were                         verified prior to the procedure by the nurse. The                         procedure was verified in the procedure room.                        - ASA Grade Assessment: III - A patient with severe                         systemic disease.                        - After reviewing the risks and benefits, the patient                         was deemed in satisfactory condition to undergo the                         procedure.                        After obtaining informed consent, the colonoscope was                         passed under direct vision. Throughout the procedure,                         the patient's blood pressure, pulse, and oxygen  saturations were monitored continuously. The                         Colonoscope was introduced through the anus and                         advanced to the the cecum, identified by appendiceal                         orifice and ileocecal valve. The colonoscopy  was                         performed without difficulty. The patient tolerated                         the procedure well. The quality of the bowel                         preparation was good. The ileocecal valve, appendiceal                         orifice, and rectum were photographed. Findings:      The perianal and digital rectal examinations were normal. Pertinent       negatives include normal sphincter tone and no palpable rectal lesions.      Non-bleeding internal hemorrhoids were found during retroflexion. The       hemorrhoids were Grade I (internal hemorrhoids that do not prolapse).      Anal papilla(e) were hypertrophied.      The colon (entire examined portion) appeared normal. Impression:            - Non-bleeding internal hemorrhoids.                        - Anal papilla(e) were hypertrophied.                        - The entire examined colon is normal.                        - No specimens collected. Recommendation:        - Await pathology results from EGD, also performed                         today.                        - Use Protonix (pantoprazole) 40 mg PO daily.                        - No aspirin, ibuprofen, naproxen, or other                         non-steroidal anti-inflammatory drugs.                        - Patient has a contact number available for                         emergencies. The signs and symptoms of potential  delayed complications were discussed with the patient.                         Return to normal activities tomorrow. Written                         discharge instructions were provided to the patient.                        - Resume previous diet.                        - Continue present medications.                        - No repeat colonoscopy due to current age (89 years                         or older) and the absence of colonic polyps.                        - Return to GI office at the next available                          appointment.                        - Follow up with Darlene Setters, NP at [facility]                         Gastroenterology.                        - The findings and recommendations were discussed with                         the patient. Procedure Code(s):     --- Professional ---                        G3875, Colorectal cancer screening; colonoscopy on                         individual not meeting criteria for high risk Diagnosis Code(s):     --- Professional ---                        K64.0, First degree hemorrhoids                        K62.89, Other specified diseases of anus and rectum                        Z12.11, Encounter for screening for malignant neoplasm                         of colon CPT copyright 2022 American Medical Association. All rights reserved. The codes documented in this report are preliminary and upon coder review may  be revised to meet current compliance requirements. Darlene Kidney MD, MD 05/17/2022 1:56:46 PM This report has been signed electronically. Number of Addenda: 0  Note Initiated On: 05/17/2022 1:23 PM Scope Withdrawal Time: 0 hours 4 minutes 6 seconds  Total Procedure Duration: 0 hours 7 minutes 37 seconds  Estimated Blood Loss:  Estimated blood loss: none.      Darlene Yang

## 2022-05-17 NOTE — H&P (Signed)
Outpatient short stay form Pre-procedure 05/17/2022 12:24 PM Darlene Yang. Darlene Yang, M.D.  Primary Physician: Darlene Yang, M.D.  Reason for visit:  Dysphagia, colon cancer screening  History of present illness:  . Dysphagia-no prior EGD, occasional solids dysphagia and will add on EGD to colonoscopy to evaluate for potential stricture. She is not feeling reflux and will hold off and PPI at this time. Diet modifications reviewed.  2. Colon cancer screening-last colonoscopy 2012, no lower GI symptoms. Denies any prior issues with sedation. Due for repeat colonoscopy    No current facility-administered medications for this encounter.  Medications Prior to Admission  Medication Sig Dispense Refill Last Dose   aspirin EC 81 MG tablet Take by mouth.      atorvastatin (LIPITOR) 40 MG tablet Take 1 tablet (40 mg total) by mouth daily. 90 tablet 0    cetirizine (ZYRTEC) 10 MG tablet Take by mouth.      cholecalciferol (VITAMIN D) 1000 units tablet Take 1,000 Units by mouth daily.      Multiple Vitamin (MULTIVITAMIN) tablet Take 1 tablet by mouth daily.        Allergies  Allergen Reactions   Prednisone    Codeine Nausea Only     Past Medical History:  Diagnosis Date   Abnormal Pap smear of cervix    s/p LEEP   Bell's palsy    with right face affect   Cervical dysplasia 1999   Hyperlipemia    Stroke (HCC) 09/2018   UTI (urinary tract infection)     Review of systems:  Otherwise negative.    Physical Exam  Gen: Alert, oriented. Appears stated age.  HEENT: [place]/AT. PERRLA. Lungs: CTA, no wheezes. CV: RR nl S1, S2. Abd: soft, benign, no masses. BS+ Ext: No edema. Pulses 2+    Planned procedures: Proceed with EGD and colonoscopy. The patient understands the nature of the planned procedure, indications, risks, alternatives and potential complications including but not limited to bleeding, infection, perforation, damage to internal organs and possible oversedation/side effects  from anesthesia. The patient agrees and gives consent to proceed.  Please refer to procedure notes for findings, recommendations and patient disposition/instructions.     Darlene Yang. Darlene [facility], M.D. Gastroenterology 05/17/2022  12:24 PM

## 2022-05-18 ENCOUNTER — Encounter: Payer: Self-pay | Admitting: Internal Medicine

## 2022-05-18 NOTE — Anesthesia Postprocedure Evaluation (Signed)
Anesthesia Post Note  Patient: Darlene Yang  Procedure(s) Performed: COLONOSCOPY WITH PROPOFOL ESOPHAGOGASTRODUODENOSCOPY (EGD) WITH PROPOFOL  Patient location during evaluation: PACU Anesthesia Type: General Level of consciousness: awake and alert Pain management: pain level controlled Vital Signs Assessment: post-procedure vital signs reviewed and stable Respiratory status: spontaneous breathing, nonlabored ventilation and respiratory function stable Cardiovascular status: blood pressure returned to baseline and stable Postop Assessment: no apparent nausea or vomiting Anesthetic complications: no   No notable events documented.   Last Vitals:  Vitals:   05/17/22 1234 05/17/22 1401  BP: 139/72 123/71  Pulse: 86   Resp: 18   Temp: 36.4 C (!) 36.1 C  SpO2: 100%     Last Pain:  Vitals:   05/17/22 1431  TempSrc:   PainSc: 0-No pain                 Foye Deer

## 2022-05-19 LAB — SURGICAL PATHOLOGY

## 2022-06-14 ENCOUNTER — Ambulatory Visit
Admission: RE | Admit: 2022-06-14 | Discharge: 2022-06-14 | Disposition: A | Discharge status: Home / Self Care | Payer: Managed Care, Other (non HMO) | Source: Ambulatory Visit | Attending: Internal Medicine | Admitting: Internal Medicine

## 2022-06-14 DIAGNOSIS — Z1231 Encounter for screening mammogram for malignant neoplasm of breast: Secondary | ICD-10-CM | POA: Insufficient documentation

## 2022-06-16 ENCOUNTER — Other Ambulatory Visit: Payer: Self-pay | Admitting: Internal Medicine

## 2022-06-16 DIAGNOSIS — R921 Mammographic calcification found on diagnostic imaging of breast: Secondary | ICD-10-CM

## 2022-06-16 DIAGNOSIS — R928 Other abnormal and inconclusive findings on diagnostic imaging of breast: Secondary | ICD-10-CM

## 2022-06-29 ENCOUNTER — Ambulatory Visit
Admission: RE | Admit: 2022-06-29 | Discharge: 2022-06-29 | Disposition: A | Discharge status: Home / Self Care | Payer: Managed Care, Other (non HMO) | Source: Ambulatory Visit | Attending: Internal Medicine | Admitting: Internal Medicine

## 2022-06-29 DIAGNOSIS — R921 Mammographic calcification found on diagnostic imaging of breast: Secondary | ICD-10-CM

## 2022-06-29 DIAGNOSIS — R928 Other abnormal and inconclusive findings on diagnostic imaging of breast: Secondary | ICD-10-CM | POA: Diagnosis present

## 2022-06-30 ENCOUNTER — Other Ambulatory Visit: Payer: Self-pay | Admitting: Internal Medicine

## 2022-06-30 DIAGNOSIS — R928 Other abnormal and inconclusive findings on diagnostic imaging of breast: Secondary | ICD-10-CM

## 2022-06-30 DIAGNOSIS — R921 Mammographic calcification found on diagnostic imaging of breast: Secondary | ICD-10-CM

## 2022-07-05 ENCOUNTER — Ambulatory Visit
Admission: RE | Admit: 2022-07-05 | Discharge: 2022-07-05 | Disposition: A | Discharge status: Home / Self Care | Payer: Managed Care, Other (non HMO) | Source: Ambulatory Visit | Attending: Internal Medicine | Admitting: Internal Medicine

## 2022-07-05 ENCOUNTER — Ambulatory Visit
Admission: RE | Admit: 2022-07-05 | Discharge: 2022-07-05 | Discharge status: Home / Self Care | Payer: Managed Care, Other (non HMO) | Source: Ambulatory Visit | Attending: Internal Medicine | Admitting: Internal Medicine

## 2022-07-05 DIAGNOSIS — R921 Mammographic calcification found on diagnostic imaging of breast: Secondary | ICD-10-CM | POA: Diagnosis present

## 2022-07-05 DIAGNOSIS — R928 Other abnormal and inconclusive findings on diagnostic imaging of breast: Secondary | ICD-10-CM | POA: Diagnosis present

## 2022-07-05 HISTORY — PX: BREAST BIOPSY: SHX20

## 2022-07-05 MED ORDER — LIDOCAINE-EPINEPHRINE 1 %-1:100000 IJ SOLN
10.0000 mL | Freq: Once | INTRAMUSCULAR | Status: AC
  Administered 2022-07-05: 10 mL
  Filled 2022-07-05: qty 10

## 2022-07-05 MED ORDER — LIDOCAINE HCL (PF) 1 % IJ SOLN
15.0000 mL | Freq: Once | INTRAMUSCULAR | Status: AC
  Administered 2022-07-05: 15 mL
  Filled 2022-07-05: qty 15

## 2022-07-06 LAB — SURGICAL PATHOLOGY

## 2023-02-22 ENCOUNTER — Other Ambulatory Visit: Payer: Self-pay | Admitting: Physician Assistant

## 2023-02-22 ENCOUNTER — Ambulatory Visit
Admission: RE | Admit: 2023-02-22 | Discharge: 2023-02-22 | Disposition: A | Discharge status: Home / Self Care | Payer: Managed Care, Other (non HMO) | Source: Ambulatory Visit | Attending: Physician Assistant | Admitting: Physician Assistant

## 2023-02-22 ENCOUNTER — Ambulatory Visit
Admission: RE | Admit: 2023-02-22 | Discharge: 2023-02-22 | Disposition: A | Discharge status: Home / Self Care | Payer: Managed Care, Other (non HMO) | Attending: Physician Assistant | Admitting: Physician Assistant

## 2023-02-22 DIAGNOSIS — M25561 Pain in right knee: Secondary | ICD-10-CM

## 2023-04-30 ENCOUNTER — Other Ambulatory Visit: Payer: Self-pay | Admitting: Internal Medicine

## 2023-04-30 DIAGNOSIS — Z1231 Encounter for screening mammogram for malignant neoplasm of breast: Secondary | ICD-10-CM

## 2023-06-18 ENCOUNTER — Ambulatory Visit
Admission: RE | Admit: 2023-06-18 | Discharge: 2023-06-18 | Disposition: A | Discharge status: Home / Self Care | Payer: Managed Care, Other (non HMO) | Source: Ambulatory Visit | Attending: Internal Medicine | Admitting: Internal Medicine

## 2023-06-18 DIAGNOSIS — Z1231 Encounter for screening mammogram for malignant neoplasm of breast: Secondary | ICD-10-CM | POA: Insufficient documentation

## 2024-05-05 ENCOUNTER — Other Ambulatory Visit: Payer: Self-pay | Admitting: Internal Medicine

## 2024-05-05 DIAGNOSIS — Z1231 Encounter for screening mammogram for malignant neoplasm of breast: Secondary | ICD-10-CM

## 2024-06-18 ENCOUNTER — Inpatient Hospital Stay: Admission: RE | Admit: 2024-06-18 | Discharge: 2024-06-18 | Attending: Internal Medicine | Admitting: Internal Medicine

## 2024-06-18 DIAGNOSIS — Z1231 Encounter for screening mammogram for malignant neoplasm of breast: Secondary | ICD-10-CM | POA: Diagnosis present
